# Patient Record
Sex: Male | Born: 1945 | Race: Black or African American | Hispanic: No | Marital: Married | Smoking: Never smoker
Health system: Southern US, Community
[De-identification: ages and names within clinical notes are randomized; demographics above are authoritative.]

## PROBLEM LIST (undated history)

## (undated) DIAGNOSIS — I10 Essential (primary) hypertension: Secondary | ICD-10-CM

## (undated) DIAGNOSIS — M109 Gout, unspecified: Secondary | ICD-10-CM

## (undated) DIAGNOSIS — E785 Hyperlipidemia, unspecified: Secondary | ICD-10-CM

## (undated) DIAGNOSIS — N189 Chronic kidney disease, unspecified: Secondary | ICD-10-CM

## (undated) HISTORY — DX: Hyperlipidemia, unspecified: E78.5

## (undated) HISTORY — PX: OTHER SURGICAL HISTORY: SHX169

## (undated) HISTORY — DX: Gout, unspecified: M10.9

---

## 2006-12-10 ENCOUNTER — Ambulatory Visit: Payer: Self-pay | Admitting: Gastroenterology

## 2008-09-11 ENCOUNTER — Ambulatory Visit: Payer: Self-pay | Admitting: Family Medicine

## 2009-10-04 ENCOUNTER — Encounter: Payer: Self-pay | Admitting: Rheumatology

## 2009-10-18 ENCOUNTER — Encounter: Payer: Self-pay | Admitting: Rheumatology

## 2013-10-23 ENCOUNTER — Encounter: Payer: Self-pay | Admitting: Podiatry

## 2013-10-23 ENCOUNTER — Ambulatory Visit (INDEPENDENT_AMBULATORY_CARE_PROVIDER_SITE_OTHER): Payer: 59 | Admitting: Podiatry

## 2013-10-23 ENCOUNTER — Ambulatory Visit (INDEPENDENT_AMBULATORY_CARE_PROVIDER_SITE_OTHER): Payer: 59

## 2013-10-23 VITALS — BP 128/66 | HR 61 | Resp 18 | Ht 72.0 in | Wt 204.0 lb

## 2013-10-23 DIAGNOSIS — M7660 Achilles tendinitis, unspecified leg: Secondary | ICD-10-CM

## 2013-10-23 NOTE — Progress Notes (Signed)
   Subjective:    Patient ID: Pedro Robinson, male    DOB: 05-04-1946, 68 y.o.   MRN: [id number]  HPI Comments: Right achilles, must have injured it last Friday while running up a hill at the hospital and Saturday morning it started hurting . Back of heel is throbbing.  Get a sharp depending on position of foot . Put a ace wrap on it   Foot Pain      Review of Systems  All other systems reviewed and are negative.       Objective:   Physical Exam        Assessment & Plan:

## 2013-10-23 NOTE — Progress Notes (Signed)
Subjective:     Patient ID: Pedro Robinson, male   DOB: 1946-02-16, 68 y.o.   MRN: [id number]  Foot Pain   patient points to the right Achilles tendon stating I injured it Friday and I'm not sure if I tore it or what might be going on. States he was running uphill and started to feel it   Review of Systems  All other systems reviewed and are negative.       Objective:   Physical Exam  Nursing note and vitals reviewed. Constitutional: He is oriented to person, place, and time.  Cardiovascular: Intact distal pulses.   Musculoskeletal: Normal range of motion.  Neurological: He is oriented to person, place, and time.  Skin: Skin is warm.   neurovascular status intact with muscle strength adequate and discomfort in the right Achilles tendon mostly at the insertion into the calcaneus with some more proximal pain noted a mild to moderate nature. Checked the strength of Achilles found to be adequate with no indications of tear of the Achilles tendon    Assessment:     Acute Achilles tendinitis with inability for the patient to walk comfortably on this area    Plan:     H&P and x-ray reviewed with patient. Today I went ahead and I placed an air fracture walker to completely immobilize the tendon and try to take all stress off the posterior heel. Reappoint for us to recheck again in 4 weeks

## 2013-10-23 NOTE — Patient Instructions (Signed)

## 2013-11-05 DIAGNOSIS — M7660 Achilles tendinitis, unspecified leg: Secondary | ICD-10-CM

## 2013-11-07 ENCOUNTER — Ambulatory Visit: Payer: Self-pay | Admitting: Physician Assistant

## 2014-12-27 ENCOUNTER — Encounter: Payer: Self-pay | Admitting: *Deleted

## 2015-02-08 ENCOUNTER — Ambulatory Visit: Payer: 59

## 2015-02-08 ENCOUNTER — Ambulatory Visit
Admission: EM | Admit: 2015-02-08 | Discharge: 2015-02-08 | Disposition: A | Discharge status: Home / Self Care | Payer: 59 | Attending: Family Medicine | Admitting: Family Medicine

## 2015-02-08 DIAGNOSIS — R51 Headache: Secondary | ICD-10-CM

## 2015-02-08 DIAGNOSIS — M542 Cervicalgia: Secondary | ICD-10-CM | POA: Diagnosis not present

## 2015-02-08 DIAGNOSIS — R519 Headache, unspecified: Secondary | ICD-10-CM

## 2015-02-08 HISTORY — DX: Essential (primary) hypertension: I10

## 2015-02-08 MED ORDER — METAXALONE 800 MG PO TABS: 800.0000 mg | ORAL_TABLET | Freq: Three times a day (TID) | ORAL | Status: DC

## 2015-02-08 MED ORDER — MELOXICAM 15 MG PO TABS: 15.0000 mg | ORAL_TABLET | Freq: Every day | ORAL | Status: DC

## 2015-02-08 NOTE — ED Provider Notes (Signed)
CSN: [id number]     Arrival date & time 02/08/15  6962 History   First MD Initiated Contact with Patient 02/08/15 0919     Chief Complaint  Patient presents with  . Neck Pain   (Consider location/radiation/quality/duration/timing/severity/associated sxs/prior Treatment) Patient is a 69 y.o. male presenting with neck pain. The history is provided by the patient.  Neck Pain Pain location:  Generalized neck (Neck pain started about 2-3 weeks ago cleared for a week and then restarted yesterday) Quality:  Stabbing Pain radiates to:  Does not radiate Pain severity:  Moderate Pain is:  Same all the time Onset quality:  Sudden Duration: Started 1-2 days ago after bothering him for about 2 weeks. Progression:  Waxing and waning Chronicity:  New Context: not fall, not jumping from heights, not lifting a heavy object, not MCA, not MVA and not recent injury   Context comment:  Denies any trauma or headaches. Relieved by: Warm compresses. Worsened by:  Nothing tried Ineffective treatments:  None tried Associated symptoms: no bladder incontinence, no bowel incontinence, no chest pain, no fever, no headaches, no leg pain, no numbness, no paresis, no photophobia, no visual change, no weakness and no weight loss   Risk factors: no hx of head and neck radiation, no hx of osteoporosis, no hx of spinal trauma, no recent epidural, no recent head injury and no recurrent falls     Past Medical History  Diagnosis Date  . Hypertension    Past Surgical History  Procedure Laterality Date  . Left foot     History reviewed. No pertinent family history. History  Substance Use Topics  . Smoking status: Never Smoker   . Smokeless tobacco: Never Used  . Alcohol Use: No    Review of Systems  Constitutional: Negative for fever and weight loss.  Eyes: Negative for photophobia.  Cardiovascular: Negative for chest pain.  Gastrointestinal: Negative for bowel incontinence.  Genitourinary: Negative for  bladder incontinence.  Musculoskeletal: Positive for neck pain.  Neurological: Negative for weakness, numbness and headaches.  All other systems reviewed and are negative.   Allergies  Ivp dye  Home Medications   Prior to Admission medications   Medication Sig Start Date End Date Taking? Authorizing Provider  aspirin 81 MG tablet Take 81 mg by mouth daily.   Yes Historical Provider, MD  cloNIDine (CATAPRES) 0.2 MG tablet Take 0.2 mg by mouth 2 (two) times daily.   Yes Historical Provider, MD  fluticasone (VERAMYST) 27.5 MCG/SPRAY nasal spray Place 2 sprays into the nose daily.   Yes Historical Provider, MD  Misc Natural Products (OSTEO BI-FLEX ADV DOUBLE ST) CAPS Take by mouth.   Yes Historical Provider, MD  Multiple Vitamins-Minerals (MULTIVITAMIN WITH MINERALS) tablet Take 1 tablet by mouth daily.   Yes Historical Provider, MD  Olmesartan-Amlodipine-HCTZ (TRIBENZOR) 40-10-25 MG TABS Take by mouth.   Yes Historical Provider, MD  Omega-3 Fatty Acids (FISH OIL) 1000 MG CAPS Take by mouth.   Yes Historical Provider, MD  rosuvastatin (CRESTOR) 10 MG tablet Take 10 mg by mouth daily.   Yes Historical Provider, MD  senna-docusate (PERI-COLACE) 8.6-50 MG per tablet Take 3 tablets by mouth at bedtime as needed for mild constipation.   Yes Historical Provider, MD  meloxicam (MOBIC) 15 MG tablet Take 1 tablet (15 mg total) by mouth daily. 02/08/15   Hassan Rowan, MD  metaxalone (SKELAXIN) 800 MG tablet Take 1 tablet (800 mg total) by mouth 3 (three) times daily. 02/08/15   Hassan Rowan, MD  traMADol-acetaminophen (ULTRACET) 37.5-325 MG per tablet Take 1 tablet by mouth every 6 (six) hours as needed.    Historical Provider, MD   BP 140/67 mmHg  Temp(Src) 98.4 F (36.9 C) (Tympanic)  Resp 16  Ht 6' (1.829 m)  Wt 206 lb (93.441 kg)  BMI 27.93 kg/m2  SpO2 99% Physical Exam  Constitutional: He is oriented to person, place, and time. He appears well-developed and well-nourished.  HENT:  Head:  Normocephalic and atraumatic.  Eyes: Pupils are equal, round, and reactive to light.  Neck: Normal range of motion. Neck supple. No spinous process tenderness and no muscular tenderness present. No tracheal deviation and no erythema present.    Musculoskeletal: Normal range of motion.  Lymphadenopathy:    He has no cervical adenopathy.  Neurological: He is oriented to person, place, and time.  Skin: Skin is warm.  Vitals reviewed.   ED Course  Procedures (including critical care time) Labs Review Labs Reviewed - No data to display I tried to reassure patient that the pain on the neck is over the occipital opening but not over the cervical spine. Because there has been no trauma and no tenderness over the C-spine I'm not too eager to recommend cervical x-rays. However since this has been occurring off and on for the last 3-4 weeks he requests x-ray and will acquiesce to his request. Imaging Review Dg Cervical Spine Complete  02/08/2015   CLINICAL DATA:  Neck pain, stiffness with limited range of motion 1 week ago. No known injury. Pain with movement in any direction.  EXAM: CERVICAL SPINE  4+ VIEWS  COMPARISON:  None.  FINDINGS: Severe degenerative disc disease throughout the cervical spine with near complete fusion across the disc spaces from C3-4 -C6-7. Partial fusion across the 17 1. Mild right neural foraminal narrowing at C4-5 and C6-7. No fracture. Prevertebral soft tissues are normal.  IMPRESSION: Severe degenerative disc disease with fusion from C3-4 through C6-7, partial fusion across C7-T1.  Right neural foraminal narrowing as above.  No acute bony abnormality.   Electronically Signed   By: Charlett NoseKevin  Dover M.D.   On: 02/08/2015 11:08     MDM   1. Occipital pain   2. Neck pain, bilateral        Hassan RowanEugene Keiko Myricks, MD 02/08/15 1540

## 2015-02-08 NOTE — ED Notes (Signed)
Pt states " 2 weeks ago I developed a stiff neck, around the base of my skull more than my neck. The pain got better and then returned yesterday."

## 2015-02-08 NOTE — Discharge Instructions (Signed)
Neuropathic Pain We often think that pain has a physical cause. If we get rid of the cause, the pain should go away. Nerves themselves can also cause pain. It is called neuropathic pain, which means nerve abnormality. It may be difficult for the patients who have it and for the treating caregivers. Pain is usually described as acute (short-lived) or chronic (long-lasting). Acute pain is related to the physical sensations caused by an injury. It can last from a few seconds to many weeks, but it usually goes away when normal healing occurs. Chronic pain lasts beyond the typical healing time. With neuropathic pain, the nerve fibers themselves may be damaged or injured. They then send incorrect signals to other pain centers. The pain you feel is real, but the cause is not easy to find.  CAUSES  Chronic pain can result from diseases, such as diabetes and shingles (an infection related to chickenpox), or from trauma, surgery, or amputation. It can also happen without any known injury or disease. The nerves are sending pain messages, even though there is no identifiable cause for such messages.   Other common causes of neuropathy include diabetes, phantom limb pain, or Regional Pain Syndrome (RPS).  As with all forms of chronic back pain, if neuropathy is not correctly treated, there can be a number of associated problems that lead to a downward cycle for the patient. These include depression, sleeplessness, feelings of fear and anxiety, limited social interaction and inability to do normal daily activities or work.  The most dramatic and mysterious example of neuropathic pain is called "phantom limb syndrome." This occurs when an arm or a leg has been removed because of illness or injury. The brain still gets pain messages from the nerves that originally carried impulses from the missing limb. These nerves now seem to misfire and cause troubling pain.  Neuropathic pain often seems to have no cause. It responds  poorly to standard pain treatment. Neuropathic pain can occur after:  Shingles (herpes zoster virus infection).  A lasting burning sensation of the skin, caused usually by injury to a peripheral nerve.  Peripheral neuropathy which is widespread nerve damage, often caused by diabetes or alcoholism.  Phantom limb pain following an amputation.  Facial nerve problems (trigeminal neuralgia).  Multiple sclerosis.  Reflex sympathetic dystrophy.  Pain which comes with cancer and cancer chemotherapy.  Entrapment neuropathy such as when pressure is put on a nerve such as in carpal tunnel syndrome.  Back, leg, and hip problems (sciatica).  Spine or back surgery.  HIV Infection or AIDS where nerves are infected by viruses. Your caregiver can explain items in the above list which may apply to you. SYMPTOMS  Characteristics of neuropathic pain are:  Severe, sharp, electric shock-like, shooting, lightening-like, knife-like.  Pins and needles sensation.  Deep burning, deep cold, or deep ache.  Persistent numbness, tingling, or weakness.  Pain resulting from light touch or other stimulus that would not usually cause pain.  Increased sensitivity to something that would normally cause pain, such as a pinprick. Pain may persist for months or years following the healing of damaged tissues. When this happens, pain signals no longer sound an alarm about current injuries or injuries about to happen. Instead, the alarm system itself is not working correctly.  Neuropathic pain may get worse instead of better over time. For some people, it can lead to serious disability. It is important to be aware that severe injury in a limb can occur without a proper, protective pain  response.Burns, cuts, and other injuries may go unnoticed. Without proper treatment, these injuries can become infected or lead to further disability. Take any injury seriously, and consult your caregiver for treatment. DIAGNOSIS    When you have a pain with no known cause, your caregiver will probably ask some specific questions:   Do you have any other conditions, such as diabetes, shingles, multiple sclerosis, or HIV infection?  How would you describe your pain? (Neuropathic pain is often described as shooting, stabbing, burning, or searing.)  Is your pain worse at any time of the day? (Neuropathic pain is usually worse at night.)  Does the pain seem to follow a certain physical pathway?  Does the pain come from an area that has missing or injured nerves? (An example would be phantom limb pain.)  Is the pain triggered by minor things such as rubbing against the sheets at night? These questions often help define the type of pain involved. Once your caregiver knows what is happening, treatment can begin. Anticonvulsant, antidepressant drugs, and various pain relievers seem to work in some cases. If another condition, such as diabetes is involved, better management of that disorder may relieve the neuropathic pain.  TREATMENT  Neuropathic pain is frequently long-lasting and tends not to respond to treatment with narcotic type pain medication. It may respond well to other drugs such as antiseizure and antidepressant medications. Usually, neuropathic problems do not completely go away, but partial improvement is often possible with proper treatment. Your caregivers have large numbers of medications available to treat you. Do not be discouraged if you do not get immediate relief. Sometimes different medications or a combination of medications will be tried before you receive the results you are hoping for. See your caregiver if you have pain that seems to be coming from nowhere and does not go away. Help is available.  SEEK IMMEDIATE MEDICAL CARE IF:   There is a sudden change in the quality of your pain, especially if the change is on only one side of the body.  You notice changes of the skin, such as redness, black or  purple discoloration, swelling, or an ulcer.  You cannot move the affected limbs. Document Released: 05/31/2004 Document Revised: 11/26/2011 Document Reviewed: 05/31/2004 [facility] Patient Information 2015 [place], [place]. This information is not intended to replace advice given to you by your health care provider. Make sure you discuss any questions you have with your health care provider. Occipital Neuralgia Occipital neuralgia is a type of headache that causes episodes of very bad pain in the back of your head. Pain from occipital neuralgia may spread (radiate) to other parts of your head. The pain is usually brief and often goes away after you rest and relax. These headaches may be caused by irritation of the nerves that leave your spinal cord high up in your neck, just below the base of your skull (occipital nerves). Your occipital nerves transmit sensations from the back of your head, the top of your head, and the areas behind your ears. CAUSES Occipital neuralgia can occur without any known cause (primary headache syndrome). In other cases, occipital neuralgia is caused by pressure on or irritation of one of the two occipital nerves. Causes of occipital nerve compression or irritation include:  Wear and tear of the vertebrae in the neck (osteoarthritis).  Neck injury.  Disease of the disks that separate the vertebrae.  Tumors.  Gout.  Infections.  Diabetes.  Swollen blood vessels that put pressure on the occipital nerves.  Muscle spasm in the neck. SIGNS AND SYMPTOMS Pain is the main symptom of occipital neuralgia. It usually starts in the back of the head but may also be felt in other areas supplied by the occipital nerves. Pain is usually on one side but may be on both sides. You may have:   Brief episodes of very bad pain that is burning, stabbing, shocking, or shooting.  Pain behind the eye.  Pain triggered by neck movement or hair brushing.  Scalp tenderness.  Aching  in the back of the head between episodes of very bad pain. DIAGNOSIS  Your health care provider may diagnose occipital neuralgia based on your symptoms and a physical exam. During the exam, the health care provider may push on areas supplied by the occipital nerves to see if they are painful. Some tests may also be done to help in making the diagnosis. These may include:  Imaging studies of the upper spinal cord, such as an MRI or CT scan. These may show compression or spinal cord abnormalities.  Nerve block. You will get an injection of numbing medicine (local anesthetic) near the occipital nerve to see if this relieves pain. TREATMENT  Treatment may begin with simple measures, such as:   Rest.  Massage.  Heat.  Over-the-counter pain relievers. If these measures do not work, you may need other treatments, including:  Medicines such as:  Prescription-strength anti-inflammatory medicines.  Muscle relaxants.  Antiseizure medicines.  Antidepressants.  Steroid injection. This involves injections of local anesthetic and strong anti-inflammatory drugs (steroids).  Pulsed radiofrequency. Wires are implanted to deliver electrical impulses that block pain signals from the occipital nerve.  Physical therapy.  Surgery to relieve nerve pressure. HOME CARE INSTRUCTIONS  Take all medicines as directed by your health care provider.  Avoid activities that cause pain.  Rest when you have an attack of pain.  Try gentle massage or a heating pad to relieve pain.  Work with a physical therapist to learn stretching exercises you can do at home.  Try a different pillow or sleeping position.  Practice good posture.  Try to stay active. Get regular exercise that does not cause pain. Ask your health care provider to suggest safe exercises for you.  Keep all follow-up visits as directed by your health care provider. This is important. SEEK MEDICAL CARE IF:  Your medicine is not  working.  You have new or worsening symptoms. SEEK IMMEDIATE MEDICAL CARE IF:  You have very bad head pain that is not going away.  You have a sudden change in vision, balance, or speech. MAKE SURE YOU:  Understand these instructions.  Will watch your condition.  Will get help right away if you are not doing well or get worse. Document Released: 08/28/2001 Document Revised: 01/18/2014 Document Reviewed: 08/26/2013 [facility] Patient Information 2015 [place]. This information is not intended to replace advice given to you by your health care provider. Make sure you discuss any questions you have with your health care provider. Osteoarthritis Osteoarthritis is a disease that causes soreness and inflammation of a joint. It occurs when the cartilage at the affected joint wears down. Cartilage acts as a cushion, covering the ends of bones where they meet to form a joint. Osteoarthritis is the most common form of arthritis. It often occurs in older people. The joints affected most often by this condition include those in the:  Ends of the fingers.  Thumbs.  Neck.  Lower back.  Knees.  Hips. CAUSES  Over  time, the cartilage that covers the ends of bones begins to wear away. This causes bone to rub on bone, producing pain and stiffness in the affected joints.  RISK FACTORS Certain factors can increase your chances of having osteoarthritis, including:  Older age.  Excessive body weight.  Overuse of joints.  Previous joint injury. SIGNS AND SYMPTOMS   Pain, swelling, and stiffness in the joint.  Over time, the joint may lose its normal shape.  Small deposits of bone (osteophytes) may grow on the edges of the joint.  Bits of bone or cartilage can break off and float inside the joint space. This may cause more pain and damage. DIAGNOSIS  Your health care provider will do a physical exam and ask about your symptoms. Various tests may be ordered, such as:  X-rays of the  affected joint.  An MRI scan.  Blood tests to rule out other types of arthritis.  Joint fluid tests. This involves using a needle to draw fluid from the joint and examining the fluid under a microscope. TREATMENT  Goals of treatment are to control pain and improve joint function. Treatment plans may include:  A prescribed exercise program that allows for rest and joint relief.  A weight control plan.  Pain relief techniques, such as:  Properly applied heat and cold.  Electric pulses delivered to nerve endings under the skin (transcutaneous electrical nerve stimulation [TENS]).  Massage.  Certain nutritional supplements.  Medicines to control pain, such as:  Acetaminophen.  Nonsteroidal anti-inflammatory drugs (NSAIDs), such as naproxen.  Narcotic or central-acting agents, such as tramadol.  Corticosteroids. These can be given orally or as an injection.  Surgery to reposition the bones and relieve pain (osteotomy) or to remove loose pieces of bone and cartilage. Joint replacement may be needed in advanced states of osteoarthritis. HOME CARE INSTRUCTIONS   Take medicines only as directed by your health care provider.  Maintain a healthy weight. Follow your health care provider's instructions for weight control. This may include dietary instructions.  Exercise as directed. Your health care provider can recommend specific types of exercise. These may include:  Strengthening exercises. These are done to strengthen the muscles that support joints affected by arthritis. They can be performed with weights or with exercise bands to add resistance.  Aerobic activities. These are exercises, such as brisk walking or low-impact aerobics, that get your heart pumping.  Range-of-motion activities. These keep your joints limber.  Balance and agility exercises. These help you maintain daily living skills.  Rest your affected joints as directed by your health care provider.  Keep  all follow-up visits as directed by your health care provider. SEEK MEDICAL CARE IF:   Your skin turns red.  You develop a rash in addition to your joint pain.  You have worsening joint pain.  You have a fever along with joint or muscle aches. SEEK IMMEDIATE MEDICAL CARE IF:  You have a significant loss of weight or appetite.  You have night sweats. FOR MORE INFORMATION   National Institute of Arthritis and Musculoskeletal and Skin Diseases: www.niams.http://www.myers.net/  [employer] on Aging: https://walker.com/  American College of Rheumatology: www.rheumatology.org Document Released: 09/03/2005 Document Revised: 01/18/2014 Document Reviewed: 05/11/2013 [facility] Patient Information 2015 [place], [place]. This information is not intended to replace advice given to you by your health care provider. Make sure you discuss any questions you have with your health care provider.

## 2015-10-21 DIAGNOSIS — E749 Disorder of carbohydrate metabolism, unspecified: Secondary | ICD-10-CM | POA: Diagnosis not present

## 2015-10-21 DIAGNOSIS — I10 Essential (primary) hypertension: Secondary | ICD-10-CM | POA: Diagnosis not present

## 2015-10-21 DIAGNOSIS — E785 Hyperlipidemia, unspecified: Secondary | ICD-10-CM | POA: Diagnosis not present

## 2015-10-28 ENCOUNTER — Encounter: Payer: Self-pay | Admitting: Emergency Medicine

## 2015-10-28 ENCOUNTER — Ambulatory Visit
Admission: EM | Admit: 2015-10-28 | Discharge: 2015-10-28 | Disposition: A | Discharge status: Home / Self Care | Payer: 59 | Attending: Family Medicine | Admitting: Family Medicine

## 2015-10-28 DIAGNOSIS — R05 Cough: Secondary | ICD-10-CM | POA: Diagnosis not present

## 2015-10-28 DIAGNOSIS — J40 Bronchitis, not specified as acute or chronic: Secondary | ICD-10-CM | POA: Diagnosis not present

## 2015-10-28 DIAGNOSIS — R059 Cough, unspecified: Secondary | ICD-10-CM

## 2015-10-28 LAB — RAPID INFLUENZA A&B ANTIGENS (ARMC ONLY)
INFLUENZA A (ARMC): NOT DETECTED
INFLUENZA B (ARMC): NOT DETECTED

## 2015-10-28 MED ORDER — AZITHROMYCIN 250 MG PO TABS: ORAL_TABLET | ORAL | Status: DC

## 2015-10-28 NOTE — ED Provider Notes (Signed)
CSN: [id number]     Arrival date & time 10/28/15  1214 History   First MD Initiated Contact with Patient 10/28/15 1415     Chief Complaint  Patient presents with  . Influenza   (Consider location/radiation/quality/duration/timing/severity/associated sxs/prior Treatment) Patient is a 70 y.o. male presenting with flu symptoms and URI. The history is provided by the patient.  Influenza Presenting symptoms: cough, fatigue, fever, headache and rhinorrhea   Associated symptoms: nasal congestion   URI Presenting symptoms: congestion, cough, fatigue, fever and rhinorrhea   Severity:  Moderate Onset quality:  Sudden Duration:  1 week Timing:  Constant Progression:  Worsening Chronicity:  New Relieved by:  Nothing Ineffective treatments:  OTC medications Associated symptoms: headaches and sinus pain   Risk factors: sick contacts   Risk factors: no chronic kidney disease, no chronic respiratory disease, no diabetes mellitus, no immunosuppression and no recent travel     Past Medical History  Diagnosis Date  . Hypertension    Past Surgical History  Procedure Laterality Date  . Left foot     No family history on file. Social History  Substance Use Topics  . Smoking status: Never Smoker   . Smokeless tobacco: Never Used  . Alcohol Use: No    Review of Systems  Constitutional: Positive for fever and fatigue.  HENT: Positive for congestion and rhinorrhea.   Respiratory: Positive for cough.   Neurological: Positive for headaches.    Allergies  Ivp dye  Home Medications   Prior to Admission medications   Medication Sig Start Date End Date Taking? Authorizing Provider  aspirin 81 MG tablet Take 81 mg by mouth daily.    Historical Provider, MD  azithromycin (ZITHROMAX Z-PAK) 250 MG tablet 2 tabs po once day 1, then 1 tab po qd for next 4 days 10/28/15   Payton Mccallum, MD  cloNIDine (CATAPRES) 0.2 MG tablet Take 0.2 mg by mouth 2 (two) times daily.    Historical Provider, MD   fluticasone (VERAMYST) 27.5 MCG/SPRAY nasal spray Place 2 sprays into the nose daily.    Historical Provider, MD  meloxicam (MOBIC) 15 MG tablet Take 1 tablet (15 mg total) by mouth daily. 02/08/15   Hassan Rowan, MD  metaxalone (SKELAXIN) 800 MG tablet Take 1 tablet (800 mg total) by mouth 3 (three) times daily. 02/08/15   Hassan Rowan, MD  Misc Natural Products (OSTEO BI-FLEX ADV DOUBLE ST) CAPS Take by mouth.    Historical Provider, MD  Multiple Vitamins-Minerals (MULTIVITAMIN WITH MINERALS) tablet Take 1 tablet by mouth daily.    Historical Provider, MD  Olmesartan-Amlodipine-HCTZ (TRIBENZOR) 40-10-25 MG TABS Take by mouth.    Historical Provider, MD  Omega-3 Fatty Acids (FISH OIL) 1000 MG CAPS Take by mouth.    Historical Provider, MD  rosuvastatin (CRESTOR) 10 MG tablet Take 10 mg by mouth daily.    Historical Provider, MD  senna-docusate (PERI-COLACE) 8.6-50 MG per tablet Take 3 tablets by mouth at bedtime as needed for mild constipation.    Historical Provider, MD  traMADol-acetaminophen (ULTRACET) 37.5-325 MG per tablet Take 1 tablet by mouth every 6 (six) hours as needed.    Historical Provider, MD   Meds Ordered and Administered this Visit  Medications - No data to display  BP 160/75 mmHg  Pulse 62  Temp(Src) 99.6 F (37.6 C) (Tympanic)  Resp 18  Ht 6' (1.829 m)  Wt 203 lb (92.08 kg)  BMI 27.53 kg/m2  SpO2 99% No data found.   Physical Exam  Constitutional: He appears well-developed and well-nourished. No distress.  HENT:  Head: Normocephalic and atraumatic.  Right Ear: Tympanic membrane, external ear and ear canal normal.  Left Ear: Tympanic membrane, external ear and ear canal normal.  Nose: Nose normal.  Mouth/Throat: Uvula is midline, oropharynx is clear and moist and mucous membranes are normal. No oropharyngeal exudate or tonsillar abscesses.  Eyes: Conjunctivae and EOM are normal. Pupils are equal, round, and reactive to light. Right eye exhibits no discharge. Left  eye exhibits no discharge. No scleral icterus.  Neck: Normal range of motion. Neck supple. No tracheal deviation present. No thyromegaly present.  Cardiovascular: Normal rate, regular rhythm and normal heart sounds.   Pulmonary/Chest: Effort normal and breath sounds normal. No stridor. No respiratory distress. He has no wheezes. He has no rales. He exhibits no tenderness.  Diffuse rhonchi bilaterally  Lymphadenopathy:    He has no cervical adenopathy.  Neurological: He is alert.  Skin: Skin is warm and dry. No rash noted. He is not diaphoretic.  Nursing note and vitals reviewed.   ED Course  Procedures (including critical care time)  Labs Review Labs Reviewed  RAPID INFLUENZA A&B ANTIGENS [facility] ONLY)    Imaging Review No results found.   Visual Acuity Review  Right Eye Distance:   Left Eye Distance:   Bilateral Distance:    Right Eye Near:   Left Eye Near:    Bilateral Near:         MDM   1. Cough   2. Bronchitis    Discharge Medication List as of 10/28/2015  2:29 PM    START taking these medications   Details  azithromycin (ZITHROMAX Z-PAK) 250 MG tablet 2 tabs po once day 1, then 1 tab po qd for next 4 days, Normal       1. diagnosis reviewed with patient 2. rx as per orders above; reviewed possible side effects, interactions, risks and benefits  3. Recommend supportive treatment with rest, increased fluids 4. Follow-up prn if symptoms worsen or don't improve    Payton Mccallum, MD 10/28/15 1450

## 2015-10-28 NOTE — ED Notes (Signed)
Temperature 103.2, cough, chills, cold, bodyaches for 3 days

## 2015-11-02 DIAGNOSIS — N289 Disorder of kidney and ureter, unspecified: Secondary | ICD-10-CM | POA: Diagnosis not present

## 2015-11-02 DIAGNOSIS — I10 Essential (primary) hypertension: Secondary | ICD-10-CM | POA: Diagnosis not present

## 2015-11-25 DIAGNOSIS — Z7689 Persons encountering health services in other specified circumstances: Secondary | ICD-10-CM | POA: Diagnosis not present

## 2015-12-05 DIAGNOSIS — J301 Allergic rhinitis due to pollen: Secondary | ICD-10-CM | POA: Diagnosis not present

## 2015-12-05 DIAGNOSIS — E749 Disorder of carbohydrate metabolism, unspecified: Secondary | ICD-10-CM | POA: Diagnosis not present

## 2015-12-05 DIAGNOSIS — I10 Essential (primary) hypertension: Secondary | ICD-10-CM | POA: Diagnosis not present

## 2015-12-05 DIAGNOSIS — E785 Hyperlipidemia, unspecified: Secondary | ICD-10-CM | POA: Diagnosis not present

## 2015-12-05 DIAGNOSIS — N411 Chronic prostatitis: Secondary | ICD-10-CM | POA: Diagnosis not present

## 2015-12-05 DIAGNOSIS — N400 Enlarged prostate without lower urinary tract symptoms: Secondary | ICD-10-CM | POA: Diagnosis not present

## 2015-12-05 DIAGNOSIS — F5221 Male erectile disorder: Secondary | ICD-10-CM | POA: Diagnosis not present

## 2015-12-05 DIAGNOSIS — D649 Anemia, unspecified: Secondary | ICD-10-CM | POA: Diagnosis not present

## 2015-12-05 DIAGNOSIS — N289 Disorder of kidney and ureter, unspecified: Secondary | ICD-10-CM | POA: Diagnosis not present

## 2015-12-21 DIAGNOSIS — N419 Inflammatory disease of prostate, unspecified: Secondary | ICD-10-CM | POA: Diagnosis not present

## 2015-12-26 DIAGNOSIS — N289 Disorder of kidney and ureter, unspecified: Secondary | ICD-10-CM | POA: Diagnosis not present

## 2015-12-26 DIAGNOSIS — I10 Essential (primary) hypertension: Secondary | ICD-10-CM | POA: Diagnosis not present

## 2016-01-16 ENCOUNTER — Encounter: Payer: Self-pay | Admitting: Urology

## 2016-01-16 ENCOUNTER — Ambulatory Visit (INDEPENDENT_AMBULATORY_CARE_PROVIDER_SITE_OTHER): Payer: 59 | Admitting: Urology

## 2016-01-16 ENCOUNTER — Ambulatory Visit: Payer: Self-pay

## 2016-01-16 VITALS — BP 154/84 | HR 59 | Ht 72.0 in | Wt 201.0 lb

## 2016-01-16 DIAGNOSIS — R972 Elevated prostate specific antigen [PSA]: Secondary | ICD-10-CM | POA: Diagnosis not present

## 2016-01-16 NOTE — Progress Notes (Addendum)
01/16/2016 3:37 PM   Hoover BrunetteClifton B Brodhead [birth year] [id number]  Referring provider: Sherron MondayS Ahmed Tejan-Sie, MD [address] [place], [place] [address]  Chief Complaint  Patient presents with  . Elevated PSA    New Patient    HPI: 70 year old male who is referred for further evaluation and management of an elevated PSA. This was obtained as part of the patient's annual history and physical. The patient was noted to have a PSA of 4.6 and was given a ten-day course of antibiotics followed by repeat PSA which dropped to 4.2. The patient denies any significant progression of his voiding symptoms. He denies any frequency, urgency, or incontinence. He does describe difficulty voiding while taking over-the-counter cough medications for which he was taking several weeks ago. The patient also has a family history of prostate cancer, his brother was diagnosed with widely metastatic prostate cancer at the age of 70 and eventually died of his disease. The patient denies any gross hematuria or dysuria. He has no history of recurrent urinary tract infections.  PSA:  4.2 on 12/21/15 4.6 on 11/25/15 3.5 on 07/2014 3.5 on 05/2013    IPSS: 1, QoL 1 SHIM: 23  PMH: Past Medical History  Diagnosis Date  . Hypertension   . Gout   . Hyperlipemia     Surgical History: Past Surgical History  Procedure Laterality Date  . Left foot      Home Medications:    Medication List       This list is accurate as of: 01/16/16  3:37 PM.  Always use your most recent med list.               aspirin 81 MG tablet  Take 81 mg by mouth daily.     cloNIDine 0.1 MG tablet  Commonly known as:  CATAPRES     Fish Oil 1000 MG Caps  Take by mouth.     fluticasone 27.5 MCG/SPRAY nasal spray  Commonly known as:  VERAMYST  Place 2 sprays into the nose daily.     hydrALAZINE 50 MG tablet  Commonly known as:  APRESOLINE     meloxicam 15 MG tablet  Commonly known as:  MOBIC  Take 1 tablet (15 mg total) by mouth  daily.     multivitamin with minerals tablet  Take 1 tablet by mouth daily.     OSTEO BI-FLEX ADV DOUBLE ST Caps  Take by mouth.     rosuvastatin 5 MG tablet  Commonly known as:  CRESTOR     TRIBENZOR 40-10-25 MG Tabs  Generic drug:  Olmesartan-Amlodipine-HCTZ  Take by mouth.        Allergies:  Allergies  Allergen Reactions  . Ivp Dye [Iodinated Diagnostic Agents] Nausea And Vomiting    Hot flashes     Family History: Family History  Problem Relation Age of Onset  . Prostate cancer Brother   . Kidney failure Mother   . Bladder Cancer Neg Hx   . Kidney cancer Neg Hx   . Lung cancer Brother     Social History:  reports that he has never smoked. He has never used smokeless tobacco. He reports that he does not drink alcohol or use illicit drugs.  ROS: UROLOGY Frequent Urination?: No Hard to postpone urination?: No Burning/pain with urination?: No Get up at night to urinate?: No Leakage of urine?: No Urine stream starts and stops?: No Trouble starting stream?: No Do you have to strain to urinate?: No Blood in urine?: No  Urinary tract infection?: No Sexually transmitted disease?: No Injury to kidneys or bladder?: No Painful intercourse?: No Weak stream?: No Erection problems?: No Penile pain?: No  Gastrointestinal Nausea?: No Vomiting?: No Indigestion/heartburn?: No Diarrhea?: No Constipation?: No  Constitutional Fever: No Night sweats?: No Weight loss?: No Fatigue?: No  Skin Skin rash/lesions?: No Itching?: No  Eyes Blurred vision?: No Double vision?: No  Ears/Nose/Throat Sore throat?: No Sinus problems?: No  Hematologic/Lymphatic Swollen glands?: No Easy bruising?: No  Cardiovascular Leg swelling?: No Chest pain?: No  Respiratory Cough?: No Shortness of breath?: No  Endocrine Excessive thirst?: No  Musculoskeletal Back pain?: No Joint pain?: No  Neurological Headaches?: No Dizziness?: No  Psychologic Depression?:  No Anxiety?: No  Physical Exam: BP 154/84 mmHg  Pulse 59  Ht 6' (1.829 m)  Wt 201 lb (91.173 kg)  BMI 27.25 kg/m2  Constitutional:  Alert and oriented, No acute distress. HEENT: [place] AT, moist mucus membranes.  Trachea midline, no masses. Cardiovascular: No clubbing, cyanosis, or edema. Respiratory: Normal respiratory effort, no increased work of breathing. GI: Abdomen is soft, nontender, nondistended, no abdominal masses GU: No CVA tenderness.  DRE: prostate was +2 in size, symmetric, no nodules, nontender Skin: No rashes, bruises or suspicious lesions. Lymph: No cervical or inguinal adenopathy. Neurologic: Grossly intact, no focal deficits, moving all 4 extremities. Psychiatric: Normal mood and affect.  Laboratory Data: No results found for: WBC, HGB, HCT, MCV, PLT  No results found for: CREATININE  No results found for: PSA  No results found for: TESTOSTERONE  No results found for: HGBA1C  Urinalysis No results found for: COLORURINE, APPEARANCEUR, LABSPEC, PHURINE, GLUCOSEU, HGBUR, BILIRUBINUR, KETONESUR, PROTEINUR, UROBILINOGEN, NITRITE, LEUKOCYTESUR  Pertinent Imaging:   Assessment & Plan:  The patient has an elevated PSA, a history of prostate cancer within his family, as well as African-American lineage lead to a significant increase in the patient having prostate cancer. However, his PSA is 4.2 which is only slightly higher than it was 2 years prior.   1. Elevated PSA I discussed the implications of an elevated PSA with the patient as well as the pitfalls of PSA screening. At this point, we have opted to follow with a PSA in 3 months. I did briefly discuss the role of prostate biopsy and reiterated the need for close follow-up for him given his African-American lineage and family history. I splayed to the patient that it would be important for him to refrain from ejaculation/intercourse one week prior to obtaining a PSA. He would also be served well by drinking plenty  of water, at holding onto his urine for long periods of time, and avoiding constipation which all could potentially cause a artificial elevation in PSA. - PSA, total and free   Return in about 3 months (around 04/17/2016).  Crist Fat, MD  [facility] [address] [place], [place] [address] [phone]

## 2016-02-24 DIAGNOSIS — E749 Disorder of carbohydrate metabolism, unspecified: Secondary | ICD-10-CM | POA: Diagnosis not present

## 2016-02-24 DIAGNOSIS — I10 Essential (primary) hypertension: Secondary | ICD-10-CM | POA: Diagnosis not present

## 2016-02-24 DIAGNOSIS — E785 Hyperlipidemia, unspecified: Secondary | ICD-10-CM | POA: Diagnosis not present

## 2016-02-27 DIAGNOSIS — N411 Chronic prostatitis: Secondary | ICD-10-CM | POA: Diagnosis not present

## 2016-02-27 DIAGNOSIS — I10 Essential (primary) hypertension: Secondary | ICD-10-CM | POA: Diagnosis not present

## 2016-03-27 DIAGNOSIS — H40003 Preglaucoma, unspecified, bilateral: Secondary | ICD-10-CM | POA: Diagnosis not present

## 2016-05-03 ENCOUNTER — Other Ambulatory Visit: Payer: Self-pay

## 2016-05-03 DIAGNOSIS — R972 Elevated prostate specific antigen [PSA]: Secondary | ICD-10-CM

## 2016-05-04 ENCOUNTER — Other Ambulatory Visit: Payer: 59

## 2016-05-04 DIAGNOSIS — R972 Elevated prostate specific antigen [PSA]: Secondary | ICD-10-CM

## 2016-05-05 LAB — PSA: Prostate Specific Ag, Serum: 4.9 ng/mL — ABNORMAL HIGH (ref 0.0–4.0)

## 2016-05-09 ENCOUNTER — Telehealth: Payer: Self-pay

## 2016-05-09 NOTE — Telephone Encounter (Signed)
LMOM

## 2016-05-09 NOTE — Telephone Encounter (Signed)
-----   Message from Crist FatBenjamin W Herrick, MD sent at 05/08/2016  5:26 PM EDT ----- PSA slightly higher then previously, this sholud be followed up by clinic visit to discuss implications.

## 2016-05-10 NOTE — Telephone Encounter (Signed)
Spoke with pt in reference to PSA results and needing a f/u appt. Pt voiced understanding stating he has an appt on Monday.

## 2016-05-14 ENCOUNTER — Ambulatory Visit (INDEPENDENT_AMBULATORY_CARE_PROVIDER_SITE_OTHER): Payer: 59 | Admitting: Urology

## 2016-05-14 ENCOUNTER — Encounter: Payer: Self-pay | Admitting: Urology

## 2016-05-14 VITALS — BP 142/76 | HR 56 | Ht 72.0 in | Wt 210.0 lb

## 2016-05-14 DIAGNOSIS — R972 Elevated prostate specific antigen [PSA]: Secondary | ICD-10-CM

## 2016-05-14 NOTE — Progress Notes (Signed)
05/14/2016 3:54 PM   Pedro Robinson 10/18/1945 [id number]  Referring provider: Sherron Monday, MD [address] [place], [place] [address]  Chief Complaint  Patient presents with  . Follow-up    elevated PSA    HPI: 70 year old male who is referred for further evaluation and management of an elevated PSA. This was obtained as part of the patient's annual history and physical. The patient was noted to have a PSA of 4.6 and was given a ten-day course of antibiotics followed by repeat PSA which dropped to 4.2. The patient denies any significant progression of his voiding symptoms. He denies any frequency, urgency, or incontinence. He does describe difficulty voiding while taking over-the-counter cough medications for which he was taking several weeks ago. The patient does not family history of prostate cancer, his brother was diagnosed with widely metastatic lung cancer at the age of 55 and eventually died of his disease. The patient denies any gross hematuria or dysuria. He has no history of recurrent urinary tract infections.  PSA:  4.9 on 05/04/16 4.2 on 12/21/15 4.6 on 11/25/15 3.5 on 07/2014 3.5 on 05/2013    IPSS: 1, QoL 1 SHIM: 23  PMH: Past Medical History:  Diagnosis Date  . Gout   . Hyperlipemia   . Hypertension     Surgical History: Past Surgical History:  Procedure Laterality Date  . left foot      Home Medications:    Medication List       Accurate as of 05/14/16  3:54 PM. Always use your most recent med list.          aspirin 81 MG tablet Take 81 mg by mouth daily.   Fish Oil 1000 MG Caps Take by mouth.   fluticasone 27.5 MCG/SPRAY nasal spray Commonly known as:  VERAMYST Place 2 sprays into the nose daily.   hydrALAZINE 50 MG tablet Commonly known as:  APRESOLINE   meloxicam 15 MG tablet Commonly known as:  MOBIC Take 1 tablet (15 mg total) by mouth daily.   multivitamin with minerals tablet Take 1 tablet by mouth daily.   OSTEO  BI-FLEX ADV DOUBLE ST Caps Take by mouth.   rosuvastatin 5 MG tablet Commonly known as:  CRESTOR   TRIBENZOR 40-10-25 MG Tabs Generic drug:  Olmesartan-Amlodipine-HCTZ Take by mouth.       Allergies:  Allergies  Allergen Reactions  . Ivp Dye [Iodinated Diagnostic Agents] Nausea And Vomiting    Hot flashes     Family History: Family History  Problem Relation Age of Onset  . Prostate cancer Brother   . Kidney failure Mother   . Bladder Cancer Neg Hx   . Kidney cancer Neg Hx   . Lung cancer Brother     Social History:  reports that he has never smoked. He has never used smokeless tobacco. He reports that he does not drink alcohol or use drugs.  ROS: UROLOGY Frequent Urination?: No Hard to postpone urination?: No Burning/pain with urination?: No Get up at night to urinate?: No Leakage of urine?: No Urine stream starts and stops?: No Trouble starting stream?: No Do you have to strain to urinate?: No Blood in urine?: No Urinary tract infection?: No Sexually transmitted disease?: No Injury to kidneys or bladder?: No Painful intercourse?: No Weak stream?: No Erection problems?: No Penile pain?: No  Gastrointestinal Nausea?: No Vomiting?: No Indigestion/heartburn?: No Diarrhea?: No Constipation?: No  Constitutional Fever: No Night sweats?: No Weight loss?: No Fatigue?: No  Skin  Skin rash/lesions?: No Itching?: No  Eyes Blurred vision?: No Double vision?: No  Ears/Nose/Throat Sore throat?: No Sinus problems?: No  Hematologic/Lymphatic Swollen glands?: No Easy bruising?: No  Cardiovascular Leg swelling?: No Chest pain?: No  Respiratory Cough?: No Shortness of breath?: No  Endocrine Excessive thirst?: No  Musculoskeletal Back pain?: No Joint pain?: No  Neurological Headaches?: No Dizziness?: No  Psychologic Depression?: No Anxiety?: No  Physical Exam: BP (!) 142/76   Pulse (!) 56   Ht 6' (1.829 m)   Wt 95.3 kg (210 lb)    BMI 28.48 kg/m   Constitutional:  Alert and oriented, No acute distress. HEENT: [place] AT, moist mucus membranes.  Trachea midline, no masses. Cardiovascular: No clubbing, cyanosis, or edema. Respiratory: Normal respiratory effort, no increased work of breathing. GI: Abdomen is soft, nontender, nondistended, no abdominal masses GU: No CVA tenderness.  DRE: 01/2016:  prostate was +2 in size, symmetric, no nodules, nontender Skin: No rashes, bruises or suspicious lesions. Lymph: No cervical or inguinal adenopathy. Neurologic: Grossly intact, no focal deficits, moving all 4 extremities. Psychiatric: Normal mood and affect.  Laboratory Data: No results found for: WBC, HGB, HCT, MCV, PLT  No results found for: CREATININE  No results found for: PSA  No results found for: TESTOSTERONE  No results found for: HGBA1C  Urinalysis No results found for: COLORURINE, APPEARANCEUR, LABSPEC, PHURINE, GLUCOSEU, HGBUR, BILIRUBINUR, KETONESUR, PROTEINUR, UROBILINOGEN, NITRITE, LEUKOCYTESUR  Pertinent Imaging:   Assessment & Plan:  The patient has an elevated PSA, a history of prostate cancer within his family, as well as African-American lineage lead to a significant increase in the patient having prostate cancer.  PSA is slightly elevated over the past 5 months, but are the Increase or rise has been slow overall. 1. Elevated PSA We again discussed the implications of an elevated/rising PSA. Patient's PSA velocity is fairly low at this point. As such, I recommended that we follow up in 6 months with a PSA.  - PSA, total and free   Return in about 6 months (around 11/14/2016) for with PSA prior.  Crist FatHERRICK, Jousha Schwandt W, MD  [facility] [address], [address] [place], [place] [address] [phone]

## 2016-06-06 DIAGNOSIS — E785 Hyperlipidemia, unspecified: Secondary | ICD-10-CM | POA: Diagnosis not present

## 2016-06-06 DIAGNOSIS — I10 Essential (primary) hypertension: Secondary | ICD-10-CM | POA: Diagnosis not present

## 2016-06-06 DIAGNOSIS — N400 Enlarged prostate without lower urinary tract symptoms: Secondary | ICD-10-CM | POA: Diagnosis not present

## 2016-06-11 DIAGNOSIS — E785 Hyperlipidemia, unspecified: Secondary | ICD-10-CM | POA: Diagnosis not present

## 2016-06-11 DIAGNOSIS — N400 Enlarged prostate without lower urinary tract symptoms: Secondary | ICD-10-CM | POA: Diagnosis not present

## 2016-06-11 DIAGNOSIS — E749 Disorder of carbohydrate metabolism, unspecified: Secondary | ICD-10-CM | POA: Diagnosis not present

## 2016-11-15 ENCOUNTER — Ambulatory Visit: Payer: 59

## 2016-11-27 ENCOUNTER — Ambulatory Visit: Payer: 59 | Admitting: Urology

## 2016-12-11 ENCOUNTER — Other Ambulatory Visit: Payer: Self-pay

## 2016-12-11 ENCOUNTER — Encounter: Payer: Self-pay | Admitting: Urology

## 2016-12-11 ENCOUNTER — Ambulatory Visit (INDEPENDENT_AMBULATORY_CARE_PROVIDER_SITE_OTHER): Payer: 59 | Admitting: Urology

## 2016-12-11 VITALS — BP 190/97 | HR 67 | Ht 72.0 in | Wt 205.5 lb

## 2016-12-11 DIAGNOSIS — R972 Elevated prostate specific antigen [PSA]: Secondary | ICD-10-CM

## 2016-12-11 NOTE — Progress Notes (Signed)
12/11/2016 2:33 PM   Hoover BrunetteClifton B Knittle [birth year] [id number]  Referring provider: Sherron MondayS Ahmed Tejan-Sie, MD [address] [place], [place] [id number]  Chief Complaint  Patient presents with  . Follow-up    elevated PSA     HPI: 71 year old male who is referred for further evaluation and management of an elevated PSA. This was obtained as part of the patient's annual history and physical. The patient was noted to have a PSA of 4.6 and was given a ten-day course of antibiotics followed by repeat PSA which dropped to 4.2. The patient denies any significant progression of his voiding symptoms. He denies any frequency, urgency, or incontinence. He does describe difficulty voiding while taking over-the-counter cough medications for which he was taking several weeks ago.   The patient does not family history of prostate cancer, his brother was diagnosed with widely metastatic lung cancer at the age of 71 and eventually died of his disease. The patient denies any gross hematuria or dysuria. He has no history of recurrent urinary tract infections.  PSA:  4.9 on 09/19/16 4.9 on 05/04/16 4.2 on 12/21/15 4.6 on 11/25/15 3.5 on 07/2014 3.5 on 05/2013    IPSS: 1, QoL 1 SHIM: 23  Intv: The patient follows up today for follow-up of an elevated PSA. He had a PSA drawn by his primary care provider which is 4.9 January. He denies any progression of his voiding symptoms. Specifically, he denies any gross hematuria, dysuria, or worsening urge/frequency. The patient has had no trouble with his bowels. His erections are stable. His voiding symptoms are unchanged.  PMH: Past Medical History:  Diagnosis Date  . Gout   . Hyperlipemia   . Hypertension     Surgical History: Past Surgical History:  Procedure Laterality Date  . left foot      Home Medications:  Allergies as of 12/11/2016      Reactions   Ivp Dye [iodinated Diagnostic Agents] Nausea And Vomiting   Hot flashes       Medication List         Accurate as of 12/11/16  2:33 PM. Always use your most recent med list.          aspirin 81 MG tablet Take 81 mg by mouth daily.   Fish Oil 1000 MG Caps Take by mouth.   fluticasone 27.5 MCG/SPRAY nasal spray Commonly known as:  VERAMYST Place 2 sprays into the nose daily.   hydrALAZINE 50 MG tablet Commonly known as:  APRESOLINE   meloxicam 15 MG tablet Commonly known as:  MOBIC Take 1 tablet (15 mg total) by mouth daily.   multivitamin with minerals tablet Take 1 tablet by mouth daily.   OSTEO BI-FLEX ADV DOUBLE ST Caps Take by mouth.   rosuvastatin 5 MG tablet Commonly known as:  CRESTOR   TRIBENZOR 40-10-25 MG Tabs Generic drug:  Olmesartan-Amlodipine-HCTZ Take by mouth.       Allergies:  Allergies  Allergen Reactions  . Ivp Dye [Iodinated Diagnostic Agents] Nausea And Vomiting    Hot flashes     Family History: Family History  Problem Relation Age of Onset  . Prostate cancer Brother   . Kidney failure Mother   . Bladder Cancer Neg Hx   . Kidney cancer Neg Hx   . Lung cancer Brother     Social History:  reports that he has never smoked. He has never used smokeless tobacco. He reports that he does not drink alcohol or use drugs.  ROS:  UROLOGY Frequent Urination?: No Hard to postpone urination?: No Burning/pain with urination?: No Get up at night to urinate?: No Leakage of urine?: No Urine stream starts and stops?: No Trouble starting stream?: No Do you have to strain to urinate?: No Blood in urine?: No Urinary tract infection?: No Sexually transmitted disease?: No Injury to kidneys or bladder?: No Painful intercourse?: No Weak stream?: No Erection problems?: No Penile pain?: No  Gastrointestinal Nausea?: No Vomiting?: No Indigestion/heartburn?: No Diarrhea?: No Constipation?: No  Constitutional Fever: No Night sweats?: No Weight loss?: No Fatigue?: No  Skin Skin rash/lesions?: No Itching?: No  Eyes Blurred vision?:  No Double vision?: No  Ears/Nose/Throat Sore throat?: No Sinus problems?: No  Hematologic/Lymphatic Swollen glands?: No Easy bruising?: No  Cardiovascular Leg swelling?: No Chest pain?: No  Respiratory Cough?: No Shortness of breath?: No  Endocrine Excessive thirst?: No  Musculoskeletal Back pain?: No Joint pain?: No  Neurological Headaches?: No Dizziness?: No  Psychologic Depression?: No Anxiety?: No  Physical Exam: BP (!) 190/97   Pulse 67   Ht 6' (1.829 m)   Wt 93.2 kg (205 lb 8 oz)   BMI 27.87 kg/m   Constitutional:  Alert and oriented, No acute distress. HEENT: [place] AT, moist mucus membranes.  Trachea midline, no masses. Cardiovascular: No clubbing, cyanosis, or edema. Respiratory: Normal respiratory effort, no increased work of breathing. GI: Abdomen is soft, nontender, nondistended, no abdominal masses GU: No CVA tenderness.  DRE: 12/10/16:  prostate was +2 in size, symmetric, no nodules, nontender Skin: No rashes, bruises or suspicious lesions. Lymph: No cervical or inguinal adenopathy. Neurologic: Grossly intact, no focal deficits, moving all 4 extremities. Psychiatric: Normal mood and affect.  Laboratory Data: No results found for: WBC, HGB, HCT, MCV, PLT  No results found for: CREATININE  No results found for: PSA  No results found for: TESTOSTERONE  No results found for: HGBA1C  Urinalysis No results found for: COLORURINE, APPEARANCEUR, LABSPEC, PHURINE, GLUCOSEU, HGBUR, BILIRUBINUR, KETONESUR, PROTEINUR, UROBILINOGEN, NITRITE, LEUKOCYTESUR  Pertinent Imaging:   Assessment & Plan:  The patient has an elevated PSA although it has been stable/very slowly rising over the past several years. His rectal exam is reassuring. He has no family history.  1. Elevated PSA Again, we discussed the implications of an elevated PSA. Fortunately, his PSA has been relatively stable. Today we discussed the option of obtaining a 4 K score which would give  [place] the risk of him being diagnosed with intermediate/high grade prostate cancer. This may help guide our decision as to whether to proceed with biopsy. However, since his PSA has not changed significantly over the interval we would otherwise plan to follow up in 6 months. The patient is due for day have a PSA drawn by his primary care doctor within the next several weeks. We will continue some monitor his PSA closely, but defer any biopsy for at least 6 months at this time.  If the patient is able to obtain 4 K score based on insurance coverage, I would like this to be obtained in the near future so that we can disseminate this information to him and hopefully make a more educated decision in the next 6 months as to whether or not to proceed with the biopsy.   No Follow-up on file.  Crist Fat, MD  [facility] [address] [place], [place] [phone]

## 2017-05-23 ENCOUNTER — Other Ambulatory Visit: Payer: 59

## 2017-05-23 ENCOUNTER — Other Ambulatory Visit: Payer: Self-pay

## 2017-05-23 DIAGNOSIS — R972 Elevated prostate specific antigen [PSA]: Secondary | ICD-10-CM

## 2017-05-24 LAB — PSA: Prostate Specific Ag, Serum: 5.2 ng/mL — ABNORMAL HIGH (ref 0.0–4.0)

## 2017-05-27 ENCOUNTER — Encounter: Payer: Self-pay | Admitting: Urology

## 2017-05-27 ENCOUNTER — Ambulatory Visit (INDEPENDENT_AMBULATORY_CARE_PROVIDER_SITE_OTHER): Payer: 59 | Admitting: Urology

## 2017-05-27 VITALS — BP 130/63 | HR 55 | Ht 72.0 in | Wt 202.1 lb

## 2017-05-27 DIAGNOSIS — R972 Elevated prostate specific antigen [PSA]: Secondary | ICD-10-CM

## 2017-05-27 NOTE — Progress Notes (Signed)
05/27/2017 5:28 PM   Pedro Robinson [birth year] [id number]  Referring provider: Sherron Mondayejan-Sie, S Ahmed, MD [address] [place], [place] [address]  Chief Complaint  Patient presents with  . Elevated PSA    HPI: 71 year old male who is referred for further evaluation and management of an elevated PSA. This was obtained as part of the patient's annual history and physical. The patient was noted to have a PSA of 4.6 and was given a ten-day course of antibiotics followed by repeat PSA which dropped to 4.2. The patient denies any significant progression of his voiding symptoms. He denies any frequency, urgency, or incontinence. He does describe difficulty voiding while taking over-the-counter cough medications for which he was taking several weeks ago.   The patient does not family history of prostate cancer, his brother was diagnosed with widely metastatic lung cancer at the age of 982 and eventually died of his disease. The patient denies any gross hematuria or dysuria. He has no history of recurrent urinary tract infections.  PSA:  5.2 on 05/23/17 4.9 on 09/19/16 4.9 on 05/04/16 4.2 on 12/21/15 4.6 on 11/25/15 3.5 on 07/2014 3.5 on 05/2013    IPSS: 1, QoL 1 SHIM: 23  Intv: The patient presents today for follow-up of an elevated PSA. He denies any progressive voiding symptoms. He denies any hematuria. He denies any bone pain or lower extremity edema. He denies any constipation or GI type symptoms. He feels quite well.   PMH: Past Medical History:  Diagnosis Date  . Gout   . Hyperlipemia   . Hypertension     Surgical History: Past Surgical History:  Procedure Laterality Date  . left foot      Home Medications:  Allergies as of 05/27/2017      Reactions   Ivp Dye [iodinated Diagnostic Agents] Nausea And Vomiting   Hot flashes       Medication List       Accurate as of 05/27/17  5:28 PM. Always use your most recent med list.          aspirin 81 MG tablet Take 81 mg by  mouth daily.   Fish Oil 1000 MG Caps Take by mouth.   fluticasone 27.5 MCG/SPRAY nasal spray Commonly known as:  VERAMYST Place 2 sprays into the nose daily.   hydrALAZINE 50 MG tablet Commonly known as:  APRESOLINE   meloxicam 15 MG tablet Commonly known as:  MOBIC Take 1 tablet (15 mg total) by mouth daily.   multivitamin with minerals tablet Take 1 tablet by mouth daily.   OSTEO BI-FLEX ADV DOUBLE ST Caps Take by mouth.   rosuvastatin 5 MG tablet Commonly known as:  CRESTOR   TRIBENZOR 40-10-25 MG Tabs Generic drug:  Olmesartan-Amlodipine-HCTZ Take by mouth.       Allergies:  Allergies  Allergen Reactions  . Ivp Dye [Iodinated Diagnostic Agents] Nausea And Vomiting    Hot flashes     Family History: Family History  Problem Relation Age of Onset  . Prostate cancer Brother   . Kidney failure Mother   . Bladder Cancer Neg Hx   . Kidney cancer Neg Hx   . Lung cancer Brother     Social History:  reports that he has never smoked. He has never used smokeless tobacco. He reports that he does not drink alcohol or use drugs.  ROS: UROLOGY Frequent Urination?: No Hard to postpone urination?: No Burning/pain with urination?: No Get up at night to urinate?: No Leakage of  urine?: No Urine stream starts and stops?: No Trouble starting stream?: No Do you have to strain to urinate?: No Blood in urine?: No Urinary tract infection?: No Sexually transmitted disease?: No Injury to kidneys or bladder?: No Painful intercourse?: No Weak stream?: No Erection problems?: No Penile pain?: No  Gastrointestinal Nausea?: No Vomiting?: No Indigestion/heartburn?: No Diarrhea?: No Constipation?: No  Constitutional Fever: No Night sweats?: No Weight loss?: No Fatigue?: No  Skin Skin rash/lesions?: No Itching?: No  Eyes Blurred vision?: No Double vision?: No  Ears/Nose/Throat Sore throat?: No Sinus problems?: No  Hematologic/Lymphatic Swollen glands?:  No Easy bruising?: No  Cardiovascular Leg swelling?: No Chest pain?: No  Respiratory Cough?: No Shortness of breath?: No  Endocrine Excessive thirst?: No  Musculoskeletal Back pain?: No Joint pain?: No  Neurological Headaches?: No Dizziness?: No  Psychologic Depression?: No Anxiety?: No  Physical Exam: BP 130/63 (BP Location: Left Arm, Patient Position: Sitting, Cuff Size: Normal)   Pulse (!) 55   Ht 6' (1.829 m)   Wt 91.7 kg (202 lb 1.6 oz)   BMI 27.41 kg/m   Constitutional:  Alert and oriented, No acute distress. HEENT: [place] AT, moist mucus membranes.  Trachea midline, no masses. Cardiovascular: No clubbing, cyanosis, or edema. Respiratory: Normal respiratory effort, no increased work of breathing. GI: Abdomen is soft, nontender, nondistended, no abdominal masses GU: No CVA tenderness.  DRE: 12/10/16:  prostate was +2 in size, symmetric, no nodules, nontender Skin: No rashes, bruises or suspicious lesions. Lymph: No cervical or inguinal adenopathy. Neurologic: Grossly intact, no focal deficits, moving all 4 extremities. Psychiatric: Normal mood and affect.  Laboratory Data: No results found for: WBC, HGB, HCT, MCV, PLT  No results found for: CREATININE  No results found for: PSA  No results found for: TESTOSTERONE  No results found for: HGBA1C  Urinalysis No results found for: COLORURINE, APPEARANCEUR, LABSPEC, PHURINE, GLUCOSEU, HGBUR, BILIRUBINUR, KETONESUR, PROTEINUR, UROBILINOGEN, NITRITE, LEUKOCYTESUR  Pertinent Imaging:   Assessment & Plan:  The patient's PSA continues to rise, albeit very slowly. History and remains concerning to me. As such I have recommended that he consider prostate biopsy.  1. Elevated PSA Plan is to proceed with transrectal ultrasound and prostate biopsy. I have detailed the procedure for him and gone over the associated risk of infection. He understands that he will likely have some blood in his urine and stool for a few  days up to 2 weeks. He also understands that he is likely to have blood in his ejaculate for about a month. We'll try to get this scheduled for him at his convenience.   Return in about 1 month (around 06/26/2017) for prostate biospy.  Crist Fat, MD  [facility] [address], [address] [place], [place] [address] [phone]

## 2017-07-02 ENCOUNTER — Ambulatory Visit (INDEPENDENT_AMBULATORY_CARE_PROVIDER_SITE_OTHER): Payer: 59 | Admitting: Urology

## 2017-07-02 ENCOUNTER — Other Ambulatory Visit: Payer: Self-pay | Admitting: Urology

## 2017-07-02 VITALS — BP 171/78 | HR 53 | Ht 72.0 in | Wt 202.0 lb

## 2017-07-02 DIAGNOSIS — R972 Elevated prostate specific antigen [PSA]: Secondary | ICD-10-CM

## 2017-07-02 MED ORDER — LEVOFLOXACIN 500 MG PO TABS
500.0000 mg | ORAL_TABLET | Freq: Once | ORAL | Status: AC
  Administered 2017-07-02: 500 mg via ORAL

## 2017-07-02 MED ORDER — GENTAMICIN SULFATE 40 MG/ML IJ SOLN
80.0000 mg | Freq: Once | INTRAMUSCULAR | Status: AC
  Administered 2017-07-02: 80 mg via INTRAMUSCULAR

## 2017-07-02 NOTE — Progress Notes (Signed)
He returns for biopsy. He's been well. He has no dysuria or gross hematuria. His PSA is 5.2 and has risen from 3.5 since September 2014.  Prostate Biopsy Procedure   Informed consent was obtained after discussing risks/benefits of the procedure.  A time out was performed to ensure correct patient identity.  Pre-Procedure: - Last PSA Level: No results found for: PSA - Gentamicin given prophylactically - Levaquin 500 mg administered PO -DRE - normal exam -Transrectal Ultrasound performed revealing a 81.57 g prostate, prominent median lobe.  -No significant hypoechoic or median lobe noted  Procedure: - Prostate block performed using 10 cc 1% lidocaine and biopsies taken from sextant areas, a total of 12 under ultrasound guidance.  Post-Procedure: - Patient tolerated the procedure well - He was counseled to seek immediate medical attention if experiences any severe pain, significant bleeding, or fevers - Return in one week to discuss biopsy results

## 2017-07-09 LAB — PATHOLOGY REPORT

## 2017-07-12 ENCOUNTER — Other Ambulatory Visit: Payer: Self-pay | Admitting: Urology

## 2017-07-12 ENCOUNTER — Telehealth: Payer: Self-pay | Admitting: *Deleted

## 2017-07-12 DIAGNOSIS — R972 Elevated prostate specific antigen [PSA]: Secondary | ICD-10-CM

## 2017-07-12 NOTE — Telephone Encounter (Signed)
Spoke with patient and gave results. Patient will follow up in 6 months with labs prior. Patient states does he need to come to the appointment next Tuesday then because the appointment was to dicuss results.  I let him know that I would send the Doctor a message and find out and call him back.

## 2017-07-12 NOTE — Telephone Encounter (Signed)
-----   Message from Jerilee FieldMatthew Eskridge, MD sent at 07/11/2017  4:33 PM EDT ----- Notify patient his prostate biopsy was negative -- no prostate cancer was found. He should return to see us in 6 months with a PSA prior.    ----- Message ----- From: Ellin GoodieLowe, Casandra S, CMA Sent: 07/09/2017   2:48 PM To: Jerilee FieldMatthew Eskridge, MD    ----- Message ----- From: Interface, [facility] Lab Results In Sent: 07/09/2017  12:33 PM To: Jennette KettleBua [facility]

## 2017-07-15 NOTE — Telephone Encounter (Signed)
Per Dr. Mena GoesEskridge patient does not need to keep appointment for Tuesday to follow up in 6 months with psa prior. Patient understands and transferred to Glenice BowLisa Hartley to make appointment. Future psa ordered.

## 2017-07-16 ENCOUNTER — Ambulatory Visit: Payer: 59

## 2018-01-13 ENCOUNTER — Other Ambulatory Visit: Payer: BLUE CROSS/BLUE SHIELD

## 2018-01-13 DIAGNOSIS — R972 Elevated prostate specific antigen [PSA]: Secondary | ICD-10-CM

## 2018-01-14 ENCOUNTER — Encounter: Payer: Self-pay | Admitting: Family Medicine

## 2018-01-14 ENCOUNTER — Telehealth: Payer: Self-pay | Admitting: Family Medicine

## 2018-01-14 LAB — PSA: PROSTATE SPECIFIC AG, SERUM: 6.4 ng/mL — AB (ref 0.0–4.0)

## 2018-01-14 NOTE — Telephone Encounter (Signed)
-----   Message from Jerilee Field, MD sent at 01/14/2018 10:08 AM EDT ----- Notify patient -- PSA elevated and has slightly risen -- give result -- keep f/u as planned   ----- Message ----- From: Honor Loh, CMA Sent: 01/14/2018   8:52 AM To: Jerilee Field, MD    ----- Message ----- From: Interface, [facility] Lab Results In Sent: 01/14/2018   5:40 AM To: Jennette Kettle [facility]

## 2018-01-14 NOTE — Telephone Encounter (Signed)
Letter sent.

## 2018-01-20 ENCOUNTER — Encounter: Payer: Self-pay | Admitting: Urology

## 2018-01-20 ENCOUNTER — Ambulatory Visit (INDEPENDENT_AMBULATORY_CARE_PROVIDER_SITE_OTHER): Payer: BLUE CROSS/BLUE SHIELD | Admitting: Urology

## 2018-01-20 VITALS — BP 164/65 | HR 58 | Resp 16 | Ht 72.0 in | Wt 206.4 lb

## 2018-01-20 DIAGNOSIS — R972 Elevated prostate specific antigen [PSA]: Secondary | ICD-10-CM | POA: Diagnosis not present

## 2018-01-20 DIAGNOSIS — N400 Enlarged prostate without lower urinary tract symptoms: Secondary | ICD-10-CM

## 2018-01-20 NOTE — Progress Notes (Signed)
01/20/2018 8:50 AM   Pedro Robinson April 11, 1946 [id number]  Referring provider: Sherron Monday, MD [address] [place], [place] [address]  No chief complaint on file.   HPI:  72 year old male who is referred for further evaluation and management of an elevated PSA. The patient was noted to have a PSA of 4.6 and was given a ten-day course of antibiotics followed by repeat PSA which dropped to 4.2.  The patient does not family history of prostate cancer, his brother was diagnosed with widely metastatic lung cancer at the age of 36 and eventually died of his disease.   PSA/biopsy:  6.4 on 01/13/2018 Biopsy on 07/02/2017 - 82 gram prostate, benign with chronic inflammation  5.2 on 05/23/17 4.9 on 09/19/16 4.9 on 05/04/16 4.2 on 12/21/15 4.6 on 11/25/15 - ten day course abx 3.5 on 07/2014 3.5 on 05/2013    IPSS: 1, QoL 1 --  He does describe difficulty voiding while taking over-the-counter cough medications. He has no history of recurrent urinary tract infections. SHIM: 23  Pt returns in continued management of above.  He underwent biopsy last fall.  Current PSA is 6.4 with a PSAD of 0.08. He's had bronchitis and wondered if being sick could alter the PSA. No dysuria or gross hematuria. He voids without difficulty.    PMH: Past Medical History:  Diagnosis Date  . Gout   . Hyperlipemia   . Hypertension     Surgical History: Past Surgical History:  Procedure Laterality Date  . left foot      Home Medications:  Allergies as of 01/20/2018      Reactions   Ivp Dye [iodinated Diagnostic Agents] Nausea And Vomiting   Hot flashes       Medication List        Accurate as of 01/20/18  8:50 AM. Always use your most recent med list.          aspirin 81 MG tablet Take 81 mg by mouth daily.   Fish Oil 1000 MG Caps Take by mouth.   fluticasone 27.5 MCG/SPRAY nasal spray Commonly known as:  VERAMYST Place 2 sprays into the nose daily.   hydrALAZINE 50 MG  tablet Commonly known as:  APRESOLINE   meloxicam 15 MG tablet Commonly known as:  MOBIC Take 1 tablet (15 mg total) by mouth daily.   multivitamin with minerals tablet Take 1 tablet by mouth daily.   OSTEO BI-FLEX ADV DOUBLE ST Caps Take by mouth.   rosuvastatin 5 MG tablet Commonly known as:  CRESTOR   TRIBENZOR 40-10-25 MG Tabs Generic drug:  Olmesartan-amLODIPine-HCTZ Take by mouth.       Allergies:  Allergies  Allergen Reactions  . Ivp Dye [Iodinated Diagnostic Agents] Nausea And Vomiting    Hot flashes     Family History: Family History  Problem Relation Age of Onset  . Prostate cancer Brother   . Kidney failure Mother   . Bladder Cancer Neg Hx   . Kidney cancer Neg Hx   . Lung cancer Brother     Social History:  reports that he has never smoked. He has never used smokeless tobacco. He reports that he does not drink alcohol or use drugs.  ROS:                                        Physical Exam: There were no vitals taken  for this visit.  Constitutional:  Alert and oriented, No acute distress. HEENT: [place] AT, moist mucus membranes.  Trachea midline, no masses. Cardiovascular: No clubbing, cyanosis, or edema. Respiratory: Normal respiratory effort, no increased work of breathing. GI: Abdomen is soft, nontender, nondistended, no abdominal masses GU: No CVA tenderness Skin: No rashes, bruises or suspicious lesions. Neurologic: Grossly intact, no focal deficits, moving all 4 extremities. Psychiatric: Normal mood and affect.  Laboratory Data: No results found for: WBC, HGB, HCT, MCV, PLT  No results found for: CREATININE  No results found for: PSA  No results found for: TESTOSTERONE  No results found for: HGBA1C  Urinalysis No results found for: COLORURINE, APPEARANCEUR, LABSPEC, PHURINE, GLUCOSEU, HGBUR, BILIRUBINUR, KETONESUR, PROTEINUR, UROBILINOGEN, NITRITE, LEUKOCYTESUR  No results found for: LABMICR, WBCUA, RBCUA,  LABEPIT, MUCUS, BACTERIA  No results found for this or any previous visit. No results found for this or any previous visit. No results found for this or any previous visit. No results found for this or any previous visit. No results found for this or any previous visit. No results found for this or any previous visit. No results found for this or any previous visit. No results found for this or any previous visit.  Assessment & Plan:    Elevated PSA - discussed nature r/b of other lab tests, MRI and repeat bx. Also, continued surveillance. Wll repeat PSA and if remains high set up for MRI and if he can't tolerate MRI consider repeat bx.   No follow-ups on file.  Jerilee Field, MD  [facility] [address] [place], [place] [address] [phone]

## 2018-02-11 ENCOUNTER — Other Ambulatory Visit: Payer: BLUE CROSS/BLUE SHIELD

## 2018-02-11 DIAGNOSIS — R972 Elevated prostate specific antigen [PSA]: Secondary | ICD-10-CM

## 2018-02-12 LAB — PSA, TOTAL AND FREE
PSA, Free Pct: 27.6 %
PSA, Free: 1.49 ng/mL
Prostate Specific Ag, Serum: 5.4 ng/mL — ABNORMAL HIGH (ref 0.0–4.0)

## 2018-02-13 ENCOUNTER — Telehealth: Payer: Self-pay

## 2018-02-13 DIAGNOSIS — R972 Elevated prostate specific antigen [PSA]: Secondary | ICD-10-CM

## 2018-02-13 NOTE — Telephone Encounter (Signed)
-----   Message from Jerilee Field, MD sent at 02/12/2018  4:45 PM EDT ----- Notify patient -- PSA is lower and closer to baseline although PSA remains elevated -- recheck in 3 months to ensure it remains stable or decreases. Thanks.   ----- Message ----- From: Vickki Hearing, CMA Sent: 02/12/2018   8:39 AM To: Jerilee Field, MD    ----- Message ----- From: Nell Range Lab Results In Sent: 02/12/2018   5:40 AM To: Jennette Kettle [facility]

## 2018-02-13 NOTE — Telephone Encounter (Signed)
Unable to leave message. Answering machine seemed broke. Will try again

## 2018-02-18 ENCOUNTER — Telehealth: Payer: Self-pay | Admitting: Urology

## 2018-02-18 NOTE — Telephone Encounter (Signed)
Pt informed, please call and schedule 3 month lab for psa. Orders placed.

## 2018-02-18 NOTE — Addendum Note (Signed)
Addended by: Vickki HearingHOMPSON, CHRISTOPHER L on: 02/18/2018 09:47 AM   Modules accepted: Orders

## 2018-02-18 NOTE — Telephone Encounter (Signed)
Patient has a follow up app already scheduled

## 2018-07-16 ENCOUNTER — Other Ambulatory Visit: Payer: BLUE CROSS/BLUE SHIELD

## 2018-07-16 DIAGNOSIS — R972 Elevated prostate specific antigen [PSA]: Secondary | ICD-10-CM

## 2018-07-17 LAB — PSA: Prostate Specific Ag, Serum: 5 ng/mL — ABNORMAL HIGH (ref 0.0–4.0)

## 2018-07-21 ENCOUNTER — Encounter: Payer: Self-pay | Admitting: Urology

## 2018-07-21 ENCOUNTER — Ambulatory Visit (INDEPENDENT_AMBULATORY_CARE_PROVIDER_SITE_OTHER): Payer: BLUE CROSS/BLUE SHIELD | Admitting: Urology

## 2018-07-21 VITALS — BP 163/81 | HR 56 | Ht 72.0 in | Wt 195.8 lb

## 2018-07-21 DIAGNOSIS — R972 Elevated prostate specific antigen [PSA]: Secondary | ICD-10-CM

## 2018-07-21 NOTE — Progress Notes (Signed)
07/21/2018 5:21 PM   Pedro Robinson [birth year] [id number]  Referring provider: Sherron Monday, MD [address] [place], [place] [address]  Chief Complaint  Patient presents with  . Elevated PSA   Urologic history: 1.  Elevated PSA -Biopsy October 2018 PSA 5.2; 82 g prostate; benign pathology -PSA April 2019 increased 6.4 with repeat back to baseline at 39.85  HPI: 72 year old male presents for follow-up of an elevated PSA.  He states he is doing well and has no complaints.  He has no bothersome lower urinary tract symptoms.  Denies dysuria or gross hematuria.  Denies flank, abdominal, pelvic or scrotal pain.  A repeat PSA on 07/16/2018 was 5.0.   PMH: Past Medical History:  Diagnosis Date  . Gout   . Hyperlipemia   . Hypertension     Surgical History: Past Surgical History:  Procedure Laterality Date  . left foot      Home Medications:  Allergies as of 07/21/2018      Reactions   Ivp Dye [iodinated Diagnostic Agents] Nausea And Vomiting   Hot flashes       Medication List        Accurate as of 07/21/18  5:21 PM. Always use your most recent med list.          aspirin 81 MG tablet Take 81 mg by mouth daily.   diclofenac sodium 1 % Gel Commonly known as:  VOLTAREN APPLY 4 G THREE TIMES A DAY AS NEEDED FOR PAIN   Fish Oil 1000 MG Caps Take by mouth.   fluticasone 27.5 MCG/SPRAY nasal spray Commonly known as:  VERAMYST Place 2 sprays into the nose daily.   hydrALAZINE 50 MG tablet Commonly known as:  APRESOLINE   meloxicam 15 MG tablet Commonly known as:  MOBIC Take 1 tablet (15 mg total) by mouth daily.   methylPREDNISolone 4 MG Tbpk tablet Commonly known as:  MEDROL DOSEPAK TAKE 6 TABLETS ON DAY 1 AS DIRECTED ON PACKAGE AND DECREASE BY 1 TAB EACH DAY FOR A TOTAL OF 6 DAYS   multivitamin with minerals tablet Take 1 tablet by mouth daily.   OSTEO BI-FLEX ADV DOUBLE ST Caps Take by mouth.   rosuvastatin 5 MG tablet Commonly known as:   CRESTOR   traMADol-acetaminophen 37.5-325 MG tablet Commonly known as:  ULTRACET Take 1 tablet by mouth every 6 (six) hours as needed. for pain   TRIBENZOR 40-10-25 MG Tabs Generic drug:  Olmesartan-amLODIPine-HCTZ Take by mouth.       Allergies:  Allergies  Allergen Reactions  . Ivp Dye [Iodinated Diagnostic Agents] Nausea And Vomiting    Hot flashes     Family History: Family History  Problem Relation Age of Onset  . Prostate cancer Brother   . Kidney failure Mother   . Lung cancer Brother   . Bladder Cancer Neg Hx   . Kidney cancer Neg Hx     Social History:  reports that he has never smoked. He has never used smokeless tobacco. He reports that he does not drink alcohol or use drugs.  ROS: UROLOGY Frequent Urination?: No Hard to postpone urination?: No Burning/pain with urination?: No Get up at night to urinate?: No Leakage of urine?: No Urine stream starts and stops?: No Trouble starting stream?: No Do you have to strain to urinate?: No Blood in urine?: No Urinary tract infection?: No Sexually transmitted disease?: No Injury to kidneys or bladder?: No Painful intercourse?: No Weak stream?: No Erection problems?: No Penile  pain?: No  Gastrointestinal Nausea?: No Vomiting?: No Indigestion/heartburn?: No Diarrhea?: No Constipation?: No  Constitutional Fever: No Night sweats?: No Weight loss?: No Fatigue?: No  Skin Skin rash/lesions?: No Itching?: No  Eyes Blurred vision?: No Double vision?: No  Ears/Nose/Throat Sore throat?: No Sinus problems?: No  Hematologic/Lymphatic Swollen glands?: No Easy bruising?: No  Cardiovascular Leg swelling?: No Chest pain?: No  Respiratory Cough?: No Shortness of breath?: No  Endocrine Excessive thirst?: No  Musculoskeletal Back pain?: No Joint pain?: No  Neurological Headaches?: No Dizziness?: No  Psychologic Depression?: No Anxiety?: No  Physical Exam: BP (!) 163/81 (BP Location:  Left Arm, Patient Position: Sitting, Cuff Size: Normal)   Pulse (!) 56   Ht 6' (1.829 m)   Wt 195 lb 12.8 oz (88.8 kg)   BMI 26.56 kg/m   Constitutional:  Alert and oriented, No acute distress. HEENT: [place] AT, moist mucus membranes.  Trachea midline, no masses. Cardiovascular: No clubbing, cyanosis, or edema. Respiratory: Normal respiratory effort, no increased work of breathing. GI: Abdomen is soft, nontender, nondistended, no abdominal masses GU: No CVA tenderness.  Prostate 60 g, smooth without nodules Lymph: No cervical or inguinal lymphadenopathy. Skin: No rashes, bruises or suspicious lesions. Neurologic: Grossly intact, no focal deficits, moving all 4 extremities. Psychiatric: Normal mood and affect.   Assessment & Plan:   72 year old male with an elevated PSA and previous benign prostate biopsy.  His DRE is benign and most recent PSA is within baseline.  I recommended a follow-up PSA in 6 months and PSA/DRE in 1 year.  If he is stable over the next year would move to annual PSA/DRE.  Return in about 1 year (around 07/22/2019) for Recheck, PSA.   [name], MD  [facility] [address] [place], [place] [address] [phone]

## 2018-07-24 ENCOUNTER — Encounter: Payer: Self-pay | Admitting: Urology

## 2018-09-27 ENCOUNTER — Encounter: Payer: Self-pay | Admitting: Gynecology

## 2018-09-27 ENCOUNTER — Ambulatory Visit (INDEPENDENT_AMBULATORY_CARE_PROVIDER_SITE_OTHER): Payer: BLUE CROSS/BLUE SHIELD

## 2018-09-27 ENCOUNTER — Other Ambulatory Visit: Payer: Self-pay

## 2018-09-27 ENCOUNTER — Ambulatory Visit
Admission: EM | Admit: 2018-09-27 | Discharge: 2018-09-27 | Disposition: A | Discharge status: Home / Self Care | Payer: BLUE CROSS/BLUE SHIELD | Attending: Emergency Medicine | Admitting: Emergency Medicine

## 2018-09-27 DIAGNOSIS — M79672 Pain in left foot: Secondary | ICD-10-CM

## 2018-09-27 LAB — BASIC METABOLIC PANEL
Anion gap: 9 (ref 5–15)
BUN: 26 mg/dL — AB (ref 8–23)
CALCIUM: 10.1 mg/dL (ref 8.9–10.3)
CO2: 26 mmol/L (ref 22–32)
CREATININE: 1.28 mg/dL — AB (ref 0.61–1.24)
Chloride: 106 mmol/L (ref 98–111)
GFR calc Af Amer: >60 mL/min (ref >60)
GFR, EST NON AFRICAN AMERICAN: 56 mL/min — AB (ref >60)
GLUCOSE: 96 mg/dL (ref 70–99)
Potassium: 4.4 mmol/L (ref 3.5–5.1)
SODIUM: 141 mmol/L (ref 135–145)

## 2018-09-27 LAB — CBC WITH DIFFERENTIAL/PLATELET
ABS IMMATURE GRANULOCYTES: 0.01 10*3/uL (ref 0.00–0.07)
BASOS PCT: 0 %
Basophils Absolute: 0 10*3/uL (ref 0.0–0.1)
EOS ABS: 0.3 10*3/uL (ref 0.0–0.5)
Eosinophils Relative: 3 %
HCT: 37.6 % — ABNORMAL LOW (ref 39.0–52.0)
Hemoglobin: 12.6 g/dL — ABNORMAL LOW (ref 13.0–17.0)
Immature Granulocytes: 0 %
Lymphocytes Relative: 22 %
Lymphs Abs: 1.9 10*3/uL (ref 0.7–4.0)
MCH: 32.3 pg (ref 26.0–34.0)
MCHC: 33.5 g/dL (ref 30.0–36.0)
MCV: 96.4 fL (ref 80.0–100.0)
MONO ABS: 1 10*3/uL (ref 0.1–1.0)
Monocytes Relative: 11 %
NEUTROS ABS: 5.6 10*3/uL (ref 1.7–7.7)
Neutrophils Relative %: 64 %
PLATELETS: 346 10*3/uL (ref 150–400)
RBC: 3.9 MIL/uL — ABNORMAL LOW (ref 4.22–5.81)
RDW: 12.7 % (ref 11.5–15.5)
WBC: 8.7 10*3/uL (ref 4.0–10.5)
nRBC: 0 % (ref 0.0–0.2)

## 2018-09-27 MED ORDER — CEPHALEXIN 500 MG PO CAPS: 1000.0000 mg | ORAL_CAPSULE | Freq: Two times a day (BID) | ORAL | 0 refills | Status: AC

## 2018-09-27 MED ORDER — TRAMADOL HCL 50 MG PO TABS: 25.0000 mg | ORAL_TABLET | Freq: Four times a day (QID) | ORAL | 0 refills | Status: DC | PRN

## 2018-09-27 MED ORDER — CEPHALEXIN 500 MG PO CAPS: 1000.0000 mg | ORAL_CAPSULE | Freq: Two times a day (BID) | ORAL | Status: DC

## 2018-09-27 NOTE — ED Provider Notes (Signed)
HPI  SUBJECTIVE:  Pedro Robinson is a 73 y.o. male who presents with left foot/heel pain, foot erythema, swelling along the top of his left foot for the past 2 to 3 weeks.  He states that this started off with right knee pain, swelling, erythema followed by right foot pain, which then migrated to the left arch of the foot.  States the pain is now primarily in the heel of his foot.  The knee and right foot pain have resolved.  Patient states that he had an x-ray of his knee done initially which showed osteoarthritis.  He reports fevers of 100.4 at the beginning of the illness, none since.  He states he had some erythema at the knee, but thinks that it started after applying Voltaren gel to it.  He describes the left foot pain as sharp, stabbing, throbbing that is  aggravated with  walking, movement, certain positions, internal/external rotation, dorsiflexion/plantarflexion of his foot.  He has tried ice, a cane, elevation, Voltaren gel, tramadol, Tylenol.  He states that the Voltaren gel helps minimally.  the tramadol, getting his foot into certain positions also helps.  No rash, bruising, numbness or tingling, trauma to his foot.  No change in his physical activity, recent viral illness, known tick bite.  He states that this is not gout pain.  States that his left knee is fine.  He has a past medical history of kidney disease, states that his kidneys work 64%.  He has a history of gout x1 at the MTP,  Hypertension.  No history of diabetes, seizures.  UEK:[id number], Marcelino Freestone, MD   Past Medical History:  Diagnosis Date  . Gout   . Hyperlipemia   . Hypertension     Past Surgical History:  Procedure Laterality Date  . left foot      Family History  Problem Relation Age of Onset  . Prostate cancer Brother   . Kidney failure Mother   . Lung cancer Brother   . Bladder Cancer Neg Hx   . Kidney cancer Neg Hx     Social History   Tobacco Use  . Smoking status: Never Smoker  . Smokeless  tobacco: Never Used  Substance Use Topics  . Alcohol use: No  . Drug use: No    No current facility-administered medications for this encounter.   Current Outpatient Medications:  .  aspirin 81 MG tablet, Take 81 mg by mouth daily., Disp: , Rfl:  .  fluticasone (VERAMYST) 27.5 MCG/SPRAY nasal spray, Place 2 sprays into the nose daily., Disp: , Rfl:  .  hydrALAZINE (APRESOLINE) 50 MG tablet, , Disp: , Rfl: 0 .  Multiple Vitamins-Minerals (MULTIVITAMIN WITH MINERALS) tablet, Take 1 tablet by mouth daily., Disp: , Rfl:  .  Olmesartan-Amlodipine-HCTZ (TRIBENZOR) 40-10-25 MG TABS, Take by mouth., Disp: , Rfl:  .  Omega-3 Fatty Acids (FISH OIL) 1000 MG CAPS, Take by mouth., Disp: , Rfl:  .  rosuvastatin (CRESTOR) 5 MG tablet, , Disp: , Rfl: 0 .  cephALEXin (KEFLEX) 500 MG capsule, Take 2 capsules (1,000 mg total) by mouth 2 (two) times daily for 5 days., Disp: 20 capsule, Rfl: o .  traMADol (ULTRAM) 50 MG tablet, Take 0.5 tablets (25 mg total) by mouth every 6 (six) hours as needed., Disp: 15 tablet, Rfl: 0  Allergies  Allergen Reactions  . Ivp Dye [Iodinated Diagnostic Agents] Nausea And Vomiting    Hot flashes      ROS  As noted in HPI.  Physical Exam  BP 126/83 (BP Location: Left Arm)   Pulse 60   Temp 98.2 F (36.8 C) (Oral)   Resp 16   Ht 6' (1.829 m)   Wt 88.9 kg   SpO2 96%   BMI 26.58 kg/m   Constitutional: Well developed, well nourished, no acute distress Eyes:  EOMI, conjunctiva normal bilaterally HENT: Normocephalic, atraumatic,mucus membranes moist Respiratory: Normal inspiratory effort Cardiovascular: Normal rate GI: nondistended skin: No rash, skin intact Musculoskeletal:  L Ankle no swelling, erythema.  Proximal fibula NT, Distal fibula NT, Medial malleolus NT,  Deltoid ligament medially NT,  Lateral ligaments  tender, ATFL laterally  tender,  Achilles NT.  no soft tissue defects, calcaneus T, distal NVI with baseline sensation / motor to foot intact. +  Ankle pain  with dorsiflexion/plantar flexion. + Pain with inversion/eversion. - bruising.  - squeeze test. Pt able to bear weight in dept.   Left midfoot tender.  Positive erythema, increased temperature, swelling over entire left foot.  Diffuse tenderness over the metatarsals, including base of fifth metatarsal. No bruising. Skin intact everywhere including between the toes. DP 2+. . Sensation grossly intact. Patient able to move all toes actively.  no foot pain with inversion / eversion,  dorsiflexion / plantarflexion. No Tenderness along the plantar fascia. Neurologic: Alert & oriented x 3, no focal neuro deficits Psychiatric: Speech and behavior appropriate   ED Course   Medications - No data to display  Orders Placed This Encounter  Procedures  . DG Foot Complete Left    Standing Status:   Standing    Number of Occurrences:   1    Order Specific Question:   Reason for Exam (SYMPTOM  OR DIAGNOSIS REQUIRED)    Answer:   pain swelling r/o fx  . DG Ankle Complete Left    Standing Status:   Standing    Number of Occurrences:   1    Order Specific Question:   Reason for Exam (SYMPTOM  OR DIAGNOSIS REQUIRED)    Answer:   pain swelling r/o fx  . CBC with Differential    Standing Status:   Standing    Number of Occurrences:   1  . Basic metabolic panel    Standing Status:   Standing    Number of Occurrences:   1    Results for orders placed or performed during the hospital encounter of 09/27/18 (from the past 24 hour(s))  CBC with Differential     Status: Abnormal   Collection Time: 09/27/18  1:51 PM  Result Value Ref Range   WBC 8.7 4.0 - 10.5 K/uL   RBC 3.90 (L) 4.22 - 5.81 MIL/uL   Hemoglobin 12.6 (L) 13.0 - 17.0 g/dL   HCT 16.137.6 (L) 09.639.0 - 04.552.0 %   MCV 96.4 80.0 - 100.0 fL   MCH 32.3 26.0 - 34.0 pg   MCHC 33.5 30.0 - 36.0 g/dL   RDW 40.912.7 81.111.5 - 91.415.5 %   Platelets 346 150 - 400 K/uL   nRBC 0.0 0.0 - 0.2 %   Neutrophils Relative % 64 %   Neutro Abs 5.6 1.7 - 7.7 K/uL    Lymphocytes Relative 22 %   Lymphs Abs 1.9 0.7 - 4.0 K/uL   Monocytes Relative 11 %   Monocytes Absolute 1.0 0.1 - 1.0 K/uL   Eosinophils Relative 3 %   Eosinophils Absolute 0.3 0.0 - 0.5 K/uL   Basophils Relative 0 %   Basophils Absolute 0.0 0.0 - 0.1  K/uL   Immature Granulocytes 0 %   Abs Immature Granulocytes 0.01 0.00 - 0.07 K/uL  Basic metabolic panel     Status: Abnormal   Collection Time: 09/27/18  1:51 PM  Result Value Ref Range   Sodium 141 135 - 145 mmol/L   Potassium 4.4 3.5 - 5.1 mmol/L   Chloride 106 98 - 111 mmol/L   CO2 26 22 - 32 mmol/L   Glucose, Bld 96 70 - 99 mg/dL   BUN 26 (H) 8 - 23 mg/dL   Creatinine, Ser 1.911.28 (H) 0.61 - 1.24 mg/dL   Calcium 47.810.1 8.9 - 29.510.3 mg/dL   GFR calc non Af Amer 56 (L) >60 mL/min   GFR calc Af Amer >60 >60 mL/min   Anion gap 9 5 - 15   Dg Ankle Complete Left  Result Date: 09/27/2018 CLINICAL DATA:  Lateral left ankle pain and swelling for 3 weeks. No reported injury. EXAM: LEFT ANKLE COMPLETE - 3+ VIEW COMPARISON:  None. FINDINGS: No fracture or subluxation. No suspicious focal osseous lesions or osseous erosions. Small Achilles left calcaneal spur. No significant arthropathy in the left ankle. No radiopaque foreign body. IMPRESSION: No acute osseous abnormality.  Small Achilles left calcaneal spur. Electronically Signed   By: Delbert PhenixJason A Poff M.D.   On: 09/27/2018 14:16   Dg Foot Complete Left  Result Date: 09/27/2018 CLINICAL DATA:  Pain and swelling in the left foot and ankle laterally. No reported injury. EXAM: LEFT FOOT - COMPLETE 3+ VIEW COMPARISON:  None. FINDINGS: No fracture. No dislocation. No suspicious focal osseous lesions. Mild osteoarthritis at the first MTP joint. Mild-to-moderate osteoarthritis at the fifth MTP joint. Moderate osteoarthritis at the first tarsometatarsal joint. Small Achilles left calcaneal spur. No radiopaque foreign bodies. IMPRESSION: 1. No acute osseous abnormality. 2. 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 Electronically Signed   By: Delbert PhenixJason A Poff M.D.   On: 09/27/2018 14:15    ED Clinical Impression  Foot pain, left   ED Assessment/Plan  No outside records available for review.  [place] Narcotic database reviewed for this patient, and feel that the risk/benefit ratio today is favorable for proceeding with a prescription for controlled substance.  Last tramadol prescription was for 28 ultracet 32.5/325 on November 2019.  Checking a CBC, BMP for kidney function, left ankle and foot x-ray.   Labs, imaging reviewed.  No leukocytosis.  Slightly elevated BUN/creatinine, crockroft-gault creatinine clearance calculated at 65.59.  No previous BMP for comparison.  Reviewed imaging independently.  No acute osseous abnormalities, small Achilles left calcaneal spur.  Mild to moderate polyarticular osteoarthritis.  See radiology report for full details.   Doubt septic ankle joint.   It is not gout, plantar fasciitis.This could be a foot cellulitis, but I think that this is less likely.   This could be coming from the Voltaren gel use as he states that his knee and other foot were red after he started applying the Voltaren gel to it.  However I will send him home with Keflex 1000 mg p.o. twice daily for 5 days.  We talked about tramadol, he states that it works "okay" for him, we will have him start at 25 mg several times a day and he may increase it to 50 mg as needed.  He was on Ultracet 37.5/325.  We discussed that opiates will increase his fall risk. advised him to use it as sparingly as possible.  we will give him a copy of his labs today, will have him call  his primary care physician or nephrologist and see if he can start some ibuprofen, because he states that this works for him.  He will need to follow-up with his primary care physician in 3 days.  He is to go to the ER if he gets worse.  Discussed labs, imaging, MDM, treatment plan, and plan for follow-up with patient. Discussed sn/sx that  should prompt return to the ED. patient agrees with plan.   Meds ordered this encounter  Medications  . cephALEXin (KEFLEX) 500 MG capsule    Sig: Take 2 capsules (1,000 mg total) by mouth 2 (two) times daily for 5 days.    Dispense:  20 capsule    Refill:  o  . traMADol (ULTRAM) 50 MG tablet    Sig: Take 0.5 tablets (25 mg total) by mouth every 6 (six) hours as needed.    Dispense:  15 tablet    Refill:  0    *This clinic note was created using Scientist, clinical (histocompatibility and immunogenetics). Therefore, there may be occasional mistakes despite careful proofreading.   ?   Domenick Gong, MD 09/27/18 1729

## 2018-09-27 NOTE — Discharge Instructions (Signed)
Start with half a tablet of tramadol and take it with 1 g of Tylenol 3 or 4 times a day.  You may increase it to a whole tablet if the half tablet/1 g of Tylenol is not working for you.  Call your primary care physician or your nephrologist with your labs and see if you can start a short course of low-dose ibuprofen.  Stop the Voltaren gel.  Finish the antibiotics unless your primary care physician tells you to stop.  Follow-up with your doctor in 3 days.  Go to the ER for the signs and symptoms we discussed.

## 2018-09-27 NOTE — ED Triage Notes (Signed)
Per patient c/o left foot pain x couple weeks. Per patient no injury to his left foot

## 2018-10-23 ENCOUNTER — Ambulatory Visit: Payer: BLUE CROSS/BLUE SHIELD | Admitting: Podiatry

## 2018-10-23 ENCOUNTER — Ambulatory Visit (INDEPENDENT_AMBULATORY_CARE_PROVIDER_SITE_OTHER): Payer: BLUE CROSS/BLUE SHIELD

## 2018-10-23 ENCOUNTER — Encounter: Payer: Self-pay | Admitting: Podiatry

## 2018-10-23 VITALS — BP 148/75 | HR 51

## 2018-10-23 DIAGNOSIS — M7662 Achilles tendinitis, left leg: Secondary | ICD-10-CM

## 2018-10-23 DIAGNOSIS — R609 Edema, unspecified: Secondary | ICD-10-CM

## 2018-10-23 DIAGNOSIS — M129 Arthropathy, unspecified: Secondary | ICD-10-CM | POA: Diagnosis not present

## 2018-10-23 NOTE — Progress Notes (Signed)
This patient presents the office with chief complaint of a painful swollen left foot.  He states he is having pain which is worsening over the last 2 months.  Patient states he experiences pain standing and even had to use a cane in the last few months when the pain became severe.  Patient states he was seen by his medical doctor who did a uric acid test which came back.  Patient was then seen on September 27, 2018 and had x-rays of his left ankle taken.  No bony pathology noted.  Patient states he was treated with antibiotics but probably meant anti-inflammatories.  He states he has taken 400 mg of ibuprofen and it has improved.  He points to the area over the center of his left foot as the site of most pain.  Patient has tried self treatment with elevation ice and heat but the problem continues.  He presents the office today for an evaluation of his left foot.  Patient was seen by Dr.  Charlsie Merlesegal in 2015 and treated with a cam walker.  Patient states that he has kidney disease and has limited ability to take certain medications.  Vascular  Dorsalis pedis and posterior tibial pulses are palpable  B/L.  Capillary return  WNL.  Temperature gradient is  WNL.  Skin turgor  WNL  Sensorium  Senn Weinstein monofilament wire  WNL. Normal tactile sensation.  Nail Exam  Patient has normal nails with no evidence of bacterial or fungal infection.  Orthopedic  Exam  Muscle tone and muscle strength  WNL.  No limitations of motion feet  B/L.  No crepitus or joint effusion noted.  Generalized midfoot arthritis left foot.  Palpable pain at Liz-Frank joint left foot.  Pain at the insertion achilles tendon left foot.  Pain noted sesamoid apparatus left foot.    Skin  No open lesions.  Normal skin texture and turgor.  Arthritis left foot.  Achilles tendinitis left foot.  Sesamoiditis left foot.  IE.  X-rays reveal calcification at the insertion of the Achilles tendon left foot.  Arthritis noted at the midfoot left.  Severe  arthritic changes and a breakdown of the first metatarsocuneiform joint left foot is noted.  Patient has an absent tibial sesamoid left foot.  Discussed this condition with this patient.  Told the patient I believe he is experiencing acute pain and swelling due to arthritis in his left midfoot.  This patient was told that he needs to return to the cam walker that was dispensed years ago and to wear the cam walker until his foot pain resolves.  Patient was recommended power step insoles to be worn after he removes his cam walker.  Patient is to return to the office in 10 days for further evaluation and treatment   Helane GuntherGregory Casady Voshell DPM

## 2018-11-03 ENCOUNTER — Ambulatory Visit: Payer: BLUE CROSS/BLUE SHIELD | Admitting: Podiatry

## 2018-11-03 ENCOUNTER — Encounter: Payer: Self-pay | Admitting: Podiatry

## 2018-11-03 DIAGNOSIS — M7662 Achilles tendinitis, left leg: Secondary | ICD-10-CM

## 2018-11-03 DIAGNOSIS — M129 Arthropathy, unspecified: Secondary | ICD-10-CM | POA: Diagnosis not present

## 2018-11-03 NOTE — Progress Notes (Signed)
This patient presents to the office for continued evaluation of his left foot.  He was diagnosed with acute arthritis left foot.  Patient was told to wear his cam walker for 2 weeks.  No medicine was prescribed.  He presents to the office today wearing his shoes and saying he is better.  He says he is 90% improved from last visit.  He also says the powerstep insoles have helped a lot.  He presents to the office for evaluation and treatment.  .Vascular  Dorsalis pedis and posterior tibial pulses are palpable  B/L.  Capillary return  WNL.  Temperature gradient is  WNL.  Skin turgor  WNL  Sensorium  Senn Weinstein monofilament wire  WNL. Normal tactile sensation.  Nail Exam  Patient has normal nails with no evidence of bacterial or fungal infection.  Orthopedic  Exam  Muscle tone and muscle strength  WNL.  No limitations of motion feet  B/L.  No crepitus or joint effusion noted.  Foot type is unremarkable and digits show no abnormalities.  Minimal palpable pain and swelling at Lix-Franks joint left foot.  Skin  No open lesions.  Normal skin texture and turgor.  Arthritis left foot.  ROV.  Discussed this condition with this patient.  Told him to continue wearing his powerstep insoles and use the cam walker if he develops another arthritic flare-up.  Helane Gunther DPM

## 2019-01-21 ENCOUNTER — Other Ambulatory Visit: Payer: BLUE CROSS/BLUE SHIELD

## 2019-01-21 ENCOUNTER — Other Ambulatory Visit: Payer: Self-pay

## 2019-01-21 DIAGNOSIS — R972 Elevated prostate specific antigen [PSA]: Secondary | ICD-10-CM

## 2019-01-22 ENCOUNTER — Telehealth: Payer: Self-pay

## 2019-01-22 LAB — PSA: Prostate Specific Ag, Serum: 6 ng/mL — ABNORMAL HIGH (ref 0.0–4.0)

## 2019-01-22 NOTE — Telephone Encounter (Signed)
-----   Message from [name], MD sent at 01/22/2019  7:18 AM EDT ----- PSA was 6.0.  Keep scheduled November follow-up.

## 2019-01-22 NOTE — Telephone Encounter (Signed)
Patient notified

## 2019-02-23 ENCOUNTER — Other Ambulatory Visit: Payer: Self-pay

## 2019-02-23 ENCOUNTER — Other Ambulatory Visit (INDEPENDENT_AMBULATORY_CARE_PROVIDER_SITE_OTHER): Payer: Self-pay | Admitting: Student

## 2019-02-23 ENCOUNTER — Ambulatory Visit (INDEPENDENT_AMBULATORY_CARE_PROVIDER_SITE_OTHER): Payer: BC Managed Care – PPO

## 2019-02-23 DIAGNOSIS — I10 Essential (primary) hypertension: Secondary | ICD-10-CM

## 2019-07-20 ENCOUNTER — Other Ambulatory Visit: Payer: BC Managed Care – PPO

## 2019-07-20 ENCOUNTER — Other Ambulatory Visit: Payer: Self-pay

## 2019-07-20 DIAGNOSIS — R972 Elevated prostate specific antigen [PSA]: Secondary | ICD-10-CM

## 2019-07-21 LAB — PSA: Prostate Specific Ag, Serum: 7.3 ng/mL — ABNORMAL HIGH (ref 0.0–4.0)

## 2019-07-22 ENCOUNTER — Other Ambulatory Visit: Payer: BLUE CROSS/BLUE SHIELD

## 2019-07-24 ENCOUNTER — Ambulatory Visit: Payer: BLUE CROSS/BLUE SHIELD | Admitting: Urology

## 2019-09-04 ENCOUNTER — Ambulatory Visit (INDEPENDENT_AMBULATORY_CARE_PROVIDER_SITE_OTHER): Payer: BC Managed Care – PPO | Admitting: Urology

## 2019-09-04 ENCOUNTER — Other Ambulatory Visit: Payer: Self-pay

## 2019-09-04 ENCOUNTER — Encounter: Payer: Self-pay | Admitting: Urology

## 2019-09-04 VITALS — BP 146/79 | HR 67 | Ht 72.0 in | Wt 200.0 lb

## 2019-09-04 DIAGNOSIS — N400 Enlarged prostate without lower urinary tract symptoms: Secondary | ICD-10-CM

## 2019-09-04 DIAGNOSIS — R972 Elevated prostate specific antigen [PSA]: Secondary | ICD-10-CM | POA: Diagnosis not present

## 2019-09-04 NOTE — Progress Notes (Signed)
09/04/2019 11:00 AM   Pedro Robinson 01/23/1946 [id number]  Referring provider: Jodi Marble, MD [place],  [place] [address]  Chief Complaint  Patient presents with  . Elevated PSA    Urologic history: 1.  Elevated PSA -Biopsy October 2018 PSA 5.2; 82 g prostate; benign pathology   HPI: 73 y.o. male presents for annual follow-up.  Overall he states he has been doing well.  He denies bothersome lower urinary tract symptoms.  IPSS completed today was 1/35 with a quality of life rated 0/6.  Denies dysuria or gross hematuria.    PSA May 2020 slightly above baseline at 6.0.  Was repeated November 2020 and had increased to 7.3.   PMH: Past Medical History:  Diagnosis Date  . Gout   . Hyperlipemia   . Hypertension     Surgical History: Past Surgical History:  Procedure Laterality Date  . left foot      Home Medications:  Allergies as of 09/04/2019      Reactions   Ivp Dye [iodinated Diagnostic Agents] Nausea And Vomiting   Hot flashes       Medication List       Accurate as of September 04, 2019 11:00 AM. If you have any questions, ask your nurse or doctor.        STOP taking these medications   aspirin 81 MG tablet Stopped by: Abbie Sons, MD   traMADol 50 MG tablet Commonly known as: ULTRAM Stopped by: Abbie Sons, MD   Tribenzor 40-10-25 MG Tabs Generic drug: Olmesartan-amLODIPine-HCTZ Stopped by: Abbie Sons, MD     TAKE these medications   amLODipine-olmesartan 10-40 MG tablet Commonly known as: AZOR Take 1 tablet by mouth daily.   Fish Oil 1000 MG Caps Take by mouth.   fluticasone 27.5 MCG/SPRAY nasal spray Commonly known as: VERAMYST Place 2 sprays into the nose daily.   fluticasone 50 MCG/ACT nasal spray Commonly known as: FLONASE   furosemide 40 MG tablet Commonly known as: LASIX Take by mouth.   hydrALAZINE 50 MG tablet Commonly known as: APRESOLINE   multivitamin with minerals tablet Take  1 tablet by mouth daily.   rosuvastatin 5 MG tablet Commonly known as: CRESTOR       Allergies:  Allergies  Allergen Reactions  . Ivp Dye [Iodinated Diagnostic Agents] Nausea And Vomiting    Hot flashes     Family History: Family History  Problem Relation Age of Onset  . Prostate cancer Brother   . Kidney failure Mother   . Lung cancer Brother   . Bladder Cancer Neg Hx   . Kidney cancer Neg Hx     Social History:  reports that he has never smoked. He has never used smokeless tobacco. He reports that he does not drink alcohol or use drugs.  ROS: UROLOGY Frequent Urination?: No Hard to postpone urination?: No Burning/pain with urination?: No Get up at night to urinate?: No Leakage of urine?: No Urine stream starts and stops?: No Trouble starting stream?: No Do you have to strain to urinate?: No Blood in urine?: No Urinary tract infection?: No Sexually transmitted disease?: No Injury to kidneys or bladder?: No Painful intercourse?: No Weak stream?: No Erection problems?: No Penile pain?: No  Gastrointestinal Nausea?: No Vomiting?: No Indigestion/heartburn?: No Diarrhea?: No Constipation?: No  Constitutional Fever: No Night sweats?: No Weight loss?: No Fatigue?: No  Skin Skin rash/lesions?: No Itching?: No  Eyes Blurred vision?: No Double vision?: No  Ears/Nose/Throat Sore throat?: No Sinus problems?: No  Hematologic/Lymphatic Swollen glands?: No Easy bruising?: No  Cardiovascular Leg swelling?: No Chest pain?: No  Respiratory Cough?: No Shortness of breath?: No  Endocrine Excessive thirst?: No  Musculoskeletal Back pain?: No Joint pain?: No  Neurological Headaches?: No Dizziness?: No  Psychologic Depression?: No Anxiety?: No  Physical Exam: BP (!) 146/79   Pulse 67   Ht 6' (1.829 m)   Wt 200 lb (90.7 kg)   BMI 27.12 kg/m   Constitutional:  Alert and oriented, No acute distress. HEENT: [place] AT, moist mucus membranes.   Trachea midline, no masses. Cardiovascular: No clubbing, cyanosis, or edema. Respiratory: Normal respiratory effort, no increased work of breathing. GU: Prostate 60 g, smooth without nodules Skin: No rashes, bruises or suspicious lesions. Neurologic: Grossly intact, no focal deficits, moving all 4 extremities. Psychiatric: Normal mood and affect.   Assessment & Plan:    - Elevated PSA Benign DRE.  PSA is elevated above baseline.  He requested that it be repeated today.  If it remains elevated above baseline would recommend prostate MRI.  No history of pacemaker or metallic implants.   [name], MD  [facility] [address], [address] [place], [place] [address] [phone]

## 2019-09-05 LAB — PSA: Prostate Specific Ag, Serum: 8.4 ng/mL — ABNORMAL HIGH (ref 0.0–4.0)

## 2019-09-06 ENCOUNTER — Telehealth: Payer: Self-pay | Admitting: Urology

## 2019-09-06 DIAGNOSIS — R972 Elevated prostate specific antigen [PSA]: Secondary | ICD-10-CM

## 2019-09-06 NOTE — Telephone Encounter (Signed)
Repeat PSA increased to 8.4.  Recommend scheduling prostate MRI.  Order was entered.  Will call with results.

## 2019-09-07 NOTE — Telephone Encounter (Signed)
Patient informed, voiced understanding.  °

## 2019-09-17 ENCOUNTER — Telehealth: Payer: Self-pay | Admitting: Urology

## 2019-09-17 MED ORDER — DIAZEPAM 5 MG PO TABS: ORAL_TABLET | ORAL | 0 refills | Status: DC

## 2019-09-17 NOTE — Telephone Encounter (Signed)
Called pt informed him of rx sent in and the need for a driver. Pt gave verbal understanding.

## 2019-09-17 NOTE — Telephone Encounter (Signed)
Rx sent.  He will need a driver

## 2019-09-17 NOTE — Telephone Encounter (Signed)
Pt called stating that he has an MRI scheduled next week on Jan 5 and is requesting something for claustrophobia to be sent to [facility] in [place]. (pt did not know street or address) Please advise. Thanks.

## 2019-09-17 NOTE — Telephone Encounter (Signed)
Please advise. Thanks.  

## 2019-09-22 ENCOUNTER — Other Ambulatory Visit: Payer: Self-pay

## 2019-09-22 ENCOUNTER — Ambulatory Visit
Admission: RE | Admit: 2019-09-22 | Discharge: 2019-09-22 | Disposition: A | Discharge status: Home / Self Care | Payer: BC Managed Care – PPO | Source: Ambulatory Visit | Attending: Urology | Admitting: Urology

## 2019-09-22 DIAGNOSIS — R972 Elevated prostate specific antigen [PSA]: Secondary | ICD-10-CM | POA: Insufficient documentation

## 2019-09-22 MED ORDER — GADOBUTROL 1 MMOL/ML IV SOLN
9.0000 mL | Freq: Once | INTRAVENOUS | Status: AC | PRN
  Administered 2019-09-22: 11:00:00 9 mL via INTRAVENOUS

## 2019-09-26 ENCOUNTER — Telehealth: Payer: Self-pay | Admitting: Urology

## 2019-09-26 DIAGNOSIS — R972 Elevated prostate specific antigen [PSA]: Secondary | ICD-10-CM

## 2019-09-26 NOTE — Telephone Encounter (Signed)
Prostate MRI showed no lesions suspicious for high-grade prostate cancer.  PSA density was 0.13.  Recommend follow-up PSA/DRE in 6 months.

## 2019-09-28 NOTE — Addendum Note (Signed)
Addended by: Frankey Shown on: 09/28/2019 04:04 PM   Modules accepted: Orders

## 2019-09-28 NOTE — Telephone Encounter (Signed)
Called pt informed him of the information below. Pt gave verbal understanding. Lab and Follow up appts scheduled. PSA ordered.

## 2019-11-12 ENCOUNTER — Encounter: Payer: Self-pay | Admitting: Emergency Medicine

## 2019-11-12 ENCOUNTER — Ambulatory Visit
Admission: EM | Admit: 2019-11-12 | Discharge: 2019-11-12 | Disposition: A | Discharge status: Home / Self Care | Payer: BC Managed Care – PPO | Attending: Urgent Care | Admitting: Urgent Care

## 2019-11-12 ENCOUNTER — Other Ambulatory Visit: Payer: Self-pay

## 2019-11-12 DIAGNOSIS — M109 Gout, unspecified: Secondary | ICD-10-CM

## 2019-11-12 MED ORDER — PREDNISONE 10 MG (21) PO TBPK: ORAL_TABLET | Freq: Every day | ORAL | 0 refills | Status: DC

## 2019-11-12 NOTE — Discharge Instructions (Signed)
It was very nice seeing you today in clinic. Thank you for entrusting me with your care.   Rest, ice, and elevate foot. Use steroids as prescribes. May take Tylenol as well to help with your pain.   Make arrangements to follow up with your regular doctor in 1 week for re-evaluation if not improving. If your symptoms/condition worsens, please seek follow up care either here or in the ER. Please remember, our [facility] providers are "right here with you" when you need [place].   Again, it was my pleasure to take care of you today. Thank you for choosing our clinic. I hope that you start to feel better quickly.   Quentin Mulling, MSN, APRN, FNP-C, CEN Advanced Practice Provider [facility]

## 2019-11-12 NOTE — ED Triage Notes (Signed)
Patient c/o possible gout in his right foot that started on Saturday. Patient reports he has had gout before.

## 2019-11-12 NOTE — ED Provider Notes (Signed)
[place], [place]   Name: Pedro Robinson DOB: [birth year] MRN: [id number] CSN: [id number] PCP: Jodi Marble, MD  Arrival date and time:  11/12/19 0855  Chief Complaint:  Gout   NOTE: Prior to seeing the patient today, I have reviewed the triage nursing documentation and vital signs. Clinical staff has updated patient's PMH/PSHx, current medication list, and drug allergies/intolerances to ensure comprehensive history available to assist in medical decision making.   History:   HPI: Pedro Robinson is a 74 y.o. male who presents today with complaints of pain to the dorsal aspect of his RIGHT foot and RIGHT great toe. Patient denies injury. Symptoms started on Saturday and have progressively worsened. Areas of concern are tender to palpation, erythematous, and warm. He is wearing bedroom shoes to clinic today citing that pain is preventing him from wearing his normal shoes. Pain is causing an altered gait pattern whereby patient is limping and having to [place] a cane for stability. PMH (+) for gouty arthritis and he notes that this is similar to previous flares. Patient denies any fevers or chills. He is unable to identify any triggering dietary causes; denies high purine diet. Patient does note that he has been eating more peanut butter as of late. In efforts to conservatively manage his symptoms at home, the patient notes that he has used ASA, which has not helped to improve his symptoms.   Past Medical History:  Diagnosis Date  . Gout   . Hyperlipemia   . Hypertension     Past Surgical History:  Procedure Laterality Date  . left foot      Family History  Problem Relation Age of Onset  . Prostate cancer Brother   . Kidney failure Mother   . Lung cancer Brother   . Bladder Cancer Neg Hx   . Kidney cancer Neg Hx     Social History   Tobacco Use  . Smoking status: Never Smoker  . Smokeless tobacco: Never Used  Substance Use Topics  . Alcohol use: No  . Drug use: No     Patient Active Problem List   Diagnosis Date Noted  . Elevated PSA 09/04/2019  . Benign prostatic hyperplasia without lower urinary tract symptoms 09/04/2019    Home Medications:    Current Meds  Medication Sig  . amLODipine-olmesartan (AZOR) 10-40 MG tablet Take 1 tablet by mouth daily.  . furosemide (LASIX) 40 MG tablet Take by mouth.  . hydrALAZINE (APRESOLINE) 50 MG tablet   . Multiple Vitamins-Minerals (MULTIVITAMIN WITH MINERALS) tablet Take 1 tablet by mouth daily.  . Omega-3 Fatty Acids (FISH OIL) 1000 MG CAPS Take by mouth.  . rosuvastatin (CRESTOR) 5 MG tablet     Allergies:   Ivp dye [iodinated diagnostic agents]  Review of Systems (ROS):  Review of systems NEGATIVE unless otherwise noted in narrative H&P section.   Vital Signs: Today's Vitals   11/12/19 0907 11/12/19 0910 11/12/19 0924  BP:  133/67   Pulse:  (!) 53   Resp:  18   Temp:  98.3 F (36.8 C)   TempSrc:  Oral   SpO2:  100%   Weight: 200 lb (90.7 kg)    Height: 6' (1.829 m)    PainSc: 7   7     Physical Exam: Physical Exam  Constitutional: He is oriented to person, place, and time and well-developed, well-nourished, and in no distress.  HENT:  Head: Normocephalic and atraumatic.  Eyes: Pupils are equal, round, and reactive to  light.  Cardiovascular: Intact distal pulses. Bradycardia present.  Pulmonary/Chest: Effort normal. No respiratory distress.  Musculoskeletal:     Right foot: Normal range of motion. Swelling and tenderness present. No crepitus.       Feet:     Comments: (+) PMS. Marked area TTP, erythematous, and warm to touch. Capillary refill WNL.   Neurological: He is alert and oriented to person, place, and time. Gait normal.  Skin: Skin is warm and dry. No rash noted. He is not diaphoretic.  Psychiatric: Mood, memory, affect and judgment normal.  Nursing note and vitals reviewed.   Urgent Care Treatments / Results:   No orders of the defined types were placed in this  encounter.   LABS: PLEASE NOTE: all labs that were ordered this encounter are listed, however only abnormal results are displayed. Labs Reviewed - No data to display  EKG: -None  RADIOLOGY: No results found.  PROCEDURES: Procedures  MEDICATIONS RECEIVED THIS VISIT: Medications - No data to display  PERTINENT CLINICAL COURSE NOTES/UPDATES:   Initial Impression / Assessment and Plan / Urgent Care Course:  Pertinent labs & imaging results that were available during my care of the patient were personally reviewed by me and considered in my medical decision making (see lab/imaging section of note for values and interpretations).  Pedro Robinson is a 74 y.o. male who presents to [facility] today with complaints of Gout  Patient is well appearing overall in clinic today. He does not appear to be in any acute distress. Presenting symptoms (see HPI) and exam as documented above. Exam consistent with flare of known gouty arthritis flare. Discussed increasing fluid intake in efforts to ensure adequate hydration and promote uric acid dilution/excretion. Reviewed low-purine diet. Will treat flare with a 10 systemic steroid course. Patient to supplement with APAP as needed. Encouraged rest, ice, and elevation as complimentary measures to help with his pain. Patient to continue to use cane for ambulation support.   Discussed follow up with primary care physician in 1 week for re-evaluation. I have reviewed the follow up and strict return precautions for any new or worsening symptoms. Patient is aware of symptoms that would be deemed urgent/emergent, and would thus require further evaluation either here or in the emergency department. At the time of discharge, he verbalized understanding and consent with the discharge plan as it was reviewed with him. All questions were fielded by provider and/or clinic staff prior to patient discharge.    Final Clinical Impressions / Urgent Care Diagnoses:     Final diagnoses:  Acute gout of right foot, unspecified cause    New Prescriptions:  [place] Controlled Substance Registry consulted? Not Applicable  Meds ordered this encounter  Medications  . predniSONE (STERAPRED UNI-PAK 21 TAB) 10 MG (21) TBPK tablet    Sig: Take by mouth daily. 5 tabs for 2 days, then 4 tabs for 2 days, then 3 tabs for 2 days, 2 tabs for 2 days, then 1 tab by mouth daily for 2 days    Dispense:  30 tablet    Refill:  0    Recommended Follow up Care:  Patient encouraged to follow up with the following provider within the specified time frame, or sooner as dictated by the severity of his symptoms. As always, he was instructed that for any urgent/emergent care needs, he should seek care either here or in the emergency department for more immediate evaluation.  Follow-up Information    Sherron Monday, MD In  1 week.   Specialty: Internal Medicine Why: General reassessment of symptoms if not improving Contact information: [address] [place] [address] [phone]         NOTE: This note was prepared using Dragon dictation software along with smaller phrase technology. Despite my best ability to proofread, there is the potential that transcriptional errors may still occur from this process, and are completely unintentional.    Verlee Monte, NP 11/12/19 [id number]

## 2020-02-22 ENCOUNTER — Other Ambulatory Visit: Payer: BC Managed Care – PPO

## 2020-02-22 ENCOUNTER — Other Ambulatory Visit: Payer: Self-pay

## 2020-02-22 DIAGNOSIS — R972 Elevated prostate specific antigen [PSA]: Secondary | ICD-10-CM

## 2020-02-23 LAB — PSA: Prostate Specific Ag, Serum: 6.7 ng/mL — ABNORMAL HIGH (ref 0.0–4.0)

## 2020-02-26 ENCOUNTER — Ambulatory Visit: Payer: Self-pay | Admitting: Urology

## 2020-02-26 ENCOUNTER — Encounter: Payer: Self-pay | Admitting: Urology

## 2020-02-26 ENCOUNTER — Ambulatory Visit: Payer: BC Managed Care – PPO | Admitting: Urology

## 2020-02-26 ENCOUNTER — Other Ambulatory Visit: Payer: Self-pay

## 2020-02-26 VITALS — BP 129/67 | HR 70 | Ht 72.0 in | Wt 200.0 lb

## 2020-02-26 DIAGNOSIS — N400 Enlarged prostate without lower urinary tract symptoms: Secondary | ICD-10-CM

## 2020-02-26 DIAGNOSIS — R972 Elevated prostate specific antigen [PSA]: Secondary | ICD-10-CM | POA: Diagnosis not present

## 2020-02-26 NOTE — Progress Notes (Signed)
02/26/2020 1:03 PM   Pedro Robinson March 05, 1946 [id number]  Referring provider: Jodi Marble, MD [place],  [place] [address]  Chief Complaint  Patient presents with   Elevated PSA    Urologic history: 1.Elevated PSA -Biopsy October 2018 PSA 5.2; 82 g prostate; benign pathology -Prostate MRI 09/2019 for PSA bump 8.2; 67 g gland; PI-RADS 1 lesions  HPI: 74 y.o. male presents for semiannual follow-up  -No voiding complaints -Denies dysuria, gross hematuria -Denies flank, abdominal or pelvic pain -PSA 02/22/2020 6.7     PMH: Past Medical History:  Diagnosis Date   Gout    Hyperlipemia    Hypertension     Surgical History: Past Surgical History:  Procedure Laterality Date   left foot      Home Medications:  Allergies as of 02/26/2020      Reactions   Ivp Dye [iodinated Diagnostic Agents] Nausea And Vomiting   Hot flashes       Medication List       Accurate as of February 26, 2020  1:03 PM. If you have any questions, ask your nurse or doctor.        amLODipine-olmesartan 10-40 MG tablet Commonly known as: AZOR Take 1 tablet by mouth daily.   diazepam 5 MG tablet Commonly known as: VALIUM 1 tab po 30 min prior to procedure   Fish Oil 1000 MG Caps Take by mouth.   furosemide 40 MG tablet Commonly known as: LASIX Take by mouth.   hydrALAZINE 50 MG tablet Commonly known as: APRESOLINE   isosorbide mononitrate 30 MG 24 hr tablet Commonly known as: IMDUR Take 30 mg by mouth daily.   multivitamin with minerals tablet Take 1 tablet by mouth daily.   predniSONE 10 MG (21) Tbpk tablet Commonly known as: STERAPRED UNI-PAK 21 TAB Take by mouth daily. 5 tabs for 2 days, then 4 tabs for 2 days, then 3 tabs for 2 days, 2 tabs for 2 days, then 1 tab by mouth daily for 2 days   rosuvastatin 5 MG tablet Commonly known as: CRESTOR       Allergies:  Allergies  Allergen Reactions   Ivp Dye [Iodinated Diagnostic Agents] Nausea  And Vomiting    Hot flashes     Family History: Family History  Problem Relation Age of Onset   Prostate cancer Brother    Kidney failure Mother    Lung cancer Brother    Bladder Cancer Neg Hx    Kidney cancer Neg Hx     Social History:  reports that he has never smoked. He has never used smokeless tobacco. He reports that he does not drink alcohol and does not use drugs.   Physical Exam: BP 129/67    Pulse 70    Ht 6' (1.829 m)    Wt 200 lb (90.7 kg)    BMI 27.12 kg/m   Constitutional:  Alert and oriented, No acute distress. HEENT: [place] AT, moist mucus membranes.  Trachea midline, no masses. Cardiovascular: No clubbing, cyanosis, or edema. Respiratory: Normal respiratory effort, no increased work of breathing. GU: Prostate 60 g, smooth without nodules Skin: No rashes, bruises or suspicious lesions. Neurologic: Grossly intact, no focal deficits, moving all 4 extremities. Psychiatric: Normal mood and affect.   Assessment & Plan:    1. Elevated PSA Most likely secondary to BPH/inflammation.  Recent prostate MRI negative for suspicious lesions.  He desires to continue surveillance and recommend a follow-up PSA/DRE 1 year  2.  BPH without LUTS   [name], MD  [facility] [address] [place], [place] [address] [phone]

## 2021-01-03 ENCOUNTER — Other Ambulatory Visit: Payer: Self-pay | Admitting: Family Medicine

## 2021-01-03 DIAGNOSIS — R972 Elevated prostate specific antigen [PSA]: Secondary | ICD-10-CM

## 2021-01-21 ENCOUNTER — Ambulatory Visit
Admission: EM | Admit: 2021-01-21 | Discharge: 2021-01-21 | Disposition: A | Discharge status: Home / Self Care | Payer: BC Managed Care – PPO | Attending: Emergency Medicine | Admitting: Emergency Medicine

## 2021-01-21 ENCOUNTER — Encounter: Payer: Self-pay | Admitting: Emergency Medicine

## 2021-01-21 ENCOUNTER — Other Ambulatory Visit: Payer: Self-pay

## 2021-01-21 DIAGNOSIS — K0889 Other specified disorders of teeth and supporting structures: Secondary | ICD-10-CM | POA: Diagnosis not present

## 2021-01-21 MED ORDER — HYDROCODONE-ACETAMINOPHEN 5-325 MG PO TABS: 1.0000 | ORAL_TABLET | Freq: Four times a day (QID) | ORAL | 0 refills | Status: DC | PRN

## 2021-01-21 NOTE — ED Triage Notes (Signed)
Patient c/o right bottom tooth pain that started 2-3 days ago.  Patient states that he is on an antibiotic and Tylenol for pain.  Patient states that the tylenol is not helping with his pain.  Patient states that he can not take ibuprofen.

## 2021-01-21 NOTE — ED Provider Notes (Signed)
[facility]    CSN: [id number] Arrival date & time: 01/21/21  1040      History   Chief Complaint Chief Complaint  Patient presents with  . Dental Pain    HPI Pedro Robinson is a 75 y.o. male.   HPI   75 year old male here for evaluation of dental pain.  Patient reports that 2 to 3 days ago he began having pain at the site of an old root canal in his right rear lower molar.  He saw his dentist who took x-rays and identified dental infection.  He was started on amoxicillin and told to take Tylenol for the pain.  He has been taking Tylenol but it has not been helping with his pain.  Patient has a history of kidney disease and cannot take NSAIDs.  Patient denies any fever or drainage from around the tooth.  Past Medical History:  Diagnosis Date  . Gout   . Hyperlipemia   . Hypertension     Patient Active Problem List   Diagnosis Date Noted  . Elevated PSA 09/04/2019  . Benign prostatic hyperplasia without lower urinary tract symptoms 09/04/2019    Past Surgical History:  Procedure Laterality Date  . left foot         Home Medications    Prior to Admission medications   Medication Sig Start Date End Date Taking? Authorizing Provider  amLODipine-olmesartan (AZOR) 10-40 MG tablet Take 1 tablet by mouth daily. 08/22/19  Yes [provider]  furosemide (LASIX) 40 MG tablet Take by mouth. 08/12/19 01/21/21 Yes [provider]  hydrALAZINE (APRESOLINE) 50 MG tablet  11/02/15  Yes [provider]  HYDROcodone-acetaminophen (NORCO) 5-325 MG tablet Take 1 tablet by mouth every 6 (six) hours as needed for moderate pain. 01/21/21  Yes Becky Augusta, NP  isosorbide mononitrate (IMDUR) 30 MG 24 hr tablet Take 30 mg by mouth daily. 12/24/19  Yes [provider]  Multiple Vitamins-Minerals (MULTIVITAMIN WITH MINERALS) tablet Take 1 tablet by mouth daily.   Yes [provider]  Omega-3 Fatty Acids (FISH OIL) 1000 MG CAPS Take by  mouth.   Yes [provider]  rosuvastatin (CRESTOR) 5 MG tablet  11/02/15  Yes [provider]  diazepam (VALIUM) 5 MG tablet 1 tab po 30 min prior to procedure 09/17/19   Stoioff, Verna Czech, MD  fluticasone (FLONASE) 50 MCG/ACT nasal spray  10/13/18 11/12/19  [provider]  fluticasone (VERAMYST) 27.5 MCG/SPRAY nasal spray Place 2 sprays into the nose daily.  11/12/19  [provider]    Family History Family History  Problem Relation Age of Onset  . Prostate cancer Brother   . Kidney failure Mother   . Lung cancer Brother   . Bladder Cancer Neg Hx   . Kidney cancer Neg Hx     Social History Social History   Tobacco Use  . Smoking status: Never Smoker  . Smokeless tobacco: Never Used  Vaping Use  . Vaping Use: Never used  Substance Use Topics  . Alcohol use: No  . Drug use: No     Allergies   Ivp dye [iodinated diagnostic agents]   Review of Systems Review of Systems  Constitutional: Negative for activity change, appetite change and fever.  HENT: Positive for dental problem.   Hematological: Negative.   Psychiatric/Behavioral: Negative.      Physical Exam Triage Vital Signs ED Triage Vitals  Enc Vitals Group     BP 01/21/21 1101 (!) 142/71  Pulse Rate 01/21/21 1101 64     Resp 01/21/21 1101 16     Temp 01/21/21 1101 98.9 F (37.2 C)     Temp Source 01/21/21 1101 Oral     SpO2 01/21/21 1101 97 %     Weight 01/21/21 1057 200 lb (90.7 kg)     Height 01/21/21 1057 6' (1.829 m)     Head Circumference --      Peak Flow --      Pain Score 01/21/21 1057 10     Pain Loc --      Pain Edu? --      Excl. in GC? --    No data found.  Updated Vital Signs BP (!) 142/71 (BP Location: Left Arm)   Pulse 64   Temp 98.9 F (37.2 C) (Oral)   Resp 16   Ht 6' (1.829 m)   Wt 200 lb (90.7 kg)   SpO2 97%   BMI 27.12 kg/m   Visual Acuity Right Eye Distance:   Left Eye Distance:   Bilateral Distance:    Right Eye Near:    Left Eye Near:    Bilateral Near:     Physical Exam Vitals and nursing note reviewed.  Constitutional:      General: He is not in acute distress.    Appearance: Normal appearance. He is not ill-appearing.  HENT:     Head: Normocephalic and atraumatic.     Mouth/Throat:     Mouth: Mucous membranes are moist.     Pharynx: Oropharynx is clear. No oropharyngeal exudate or posterior oropharyngeal erythema.  Musculoskeletal:        General: No swelling.  Skin:    General: Skin is warm and dry.     Capillary Refill: Capillary refill takes less than 2 seconds.     Findings: No erythema.  Neurological:     General: No focal deficit present.     Mental Status: He is alert and oriented to person, place, and time.  Psychiatric:        Mood and Affect: Mood normal.        Behavior: Behavior normal.        Thought Content: Thought content normal.        Judgment: Judgment normal.      UC Treatments / Results  Labs (all labs ordered are listed, but only abnormal results are displayed) Labs Reviewed - No data to display  EKG   Radiology No results found.  Procedures Procedures (including critical care time)  Medications Ordered in UC Medications - No data to display  Initial Impression / Assessment and Plan / UC Course  I have reviewed the triage vital signs and the nursing notes.  Pertinent labs & imaging results that were available during my care of the patient were reviewed by me and considered in my medical decision making (see chart for details).   Patient is a very pleasant 75 year old male here for evaluation of dental pain in his right lower jaw.  Patient reports that the pain radiates up into his TMJ.  He says the pain is stemming from an infection at the site of old root canal in his right lower rear molar.  He denies any fever or drainage.  Physical exam reveals the presence of a crown over the site of the old root canal.  It is tender to percussion.  There is no  fullness or fluctuance to the floor of the mouth or the submental area.  There is no overlying erythema or edema.  Patient is already on amoxicillin for treatment of infection.  He states he cannot take NSAIDs due to CKD.  Will cover patient with Norco and have advised patient to ensure that he does not take any more than 4000 mg of Tylenol daily.  Patient to follow-up with oral surgery next week as previously planned.   Final Clinical Impressions(s) / UC Diagnoses   Final diagnoses:  Pain, dental     Discharge Instructions     Continue use of Tylenol as needed for mild to moderate pain and use the Norco as needed for severe pain.  Norco also contains Tylenol so keep track of the amount you are taking and do not exceed 4000 mg of Tylenol daily.  If you develop any drainage from around your tooth, swelling to the floor of her mouth, swelling under your jaw, or difficulty swallowing go to the emergency department at [facility] to be evaluated by the on-call dental team.    ED Prescriptions    Medication Sig Dispense Auth. Provider   HYDROcodone-acetaminophen (NORCO) 5-325 MG tablet Take 1 tablet by mouth every 6 (six) hours as needed for moderate pain. 20 tablet Becky Augusta, NP     I have reviewed the PDMP during this encounter.   Becky Augusta, NP 01/21/21 1146

## 2021-01-21 NOTE — Discharge Instructions (Addendum)
Continue use of Tylenol as needed for mild to moderate pain and use the Norco as needed for severe pain.  Norco also contains Tylenol so keep track of the amount you are taking and do not exceed 4000 mg of Tylenol daily.  If you develop any drainage from around your tooth, swelling to the floor of her mouth, swelling under your jaw, or difficulty swallowing go to the emergency department at [facility] to be evaluated by the on-call dental team.

## 2021-02-21 ENCOUNTER — Other Ambulatory Visit: Payer: BC Managed Care – PPO

## 2021-02-21 ENCOUNTER — Encounter: Payer: Self-pay | Admitting: Ophthalmology

## 2021-02-21 ENCOUNTER — Other Ambulatory Visit: Payer: Self-pay

## 2021-02-21 DIAGNOSIS — R972 Elevated prostate specific antigen [PSA]: Secondary | ICD-10-CM

## 2021-02-22 LAB — PSA: Prostate Specific Ag, Serum: 6.4 ng/mL — ABNORMAL HIGH (ref 0.0–4.0)

## 2021-02-24 ENCOUNTER — Other Ambulatory Visit: Payer: Self-pay

## 2021-02-24 ENCOUNTER — Encounter: Payer: Self-pay | Admitting: Urology

## 2021-02-24 ENCOUNTER — Ambulatory Visit (INDEPENDENT_AMBULATORY_CARE_PROVIDER_SITE_OTHER): Payer: BC Managed Care – PPO | Admitting: Urology

## 2021-02-24 VITALS — BP 136/66 | HR 61 | Ht 72.0 in | Wt 200.0 lb

## 2021-02-24 DIAGNOSIS — N400 Enlarged prostate without lower urinary tract symptoms: Secondary | ICD-10-CM | POA: Diagnosis not present

## 2021-02-24 DIAGNOSIS — R972 Elevated prostate specific antigen [PSA]: Secondary | ICD-10-CM

## 2021-02-24 NOTE — Progress Notes (Signed)
   02/24/2021 11:27 AM   Pedro Robinson July 16, 1946 [id number]  Referring provider: Sherron Monday, MD [address] [place],  [place] [address]  Chief Complaint  Patient presents with   Elevated PSA    Urologic history: 1.  Elevated PSA -Biopsy October 2018 PSA 5.2; 82 g prostate; benign pathology -Prostate MRI 09/2019 for PSA bump 8.2; 67 g gland; PI-RADS 1 lesions   HPI: 75 y.o. male presents for annual follow-up.  Doing well without problems since last years visit Denies dysuria, gross hematuria Denies flank, abdominal or pelvic pain PSA 02/21/2021 stable at 6.4     PMH: Past Medical History:  Diagnosis Date   Chronic kidney disease    Gout    Hyperlipemia    Hypertension     Surgical History: Past Surgical History:  Procedure Laterality Date   left foot      Home Medications:  Allergies as of 02/24/2021       Reactions   Ivp Dye [iodinated Diagnostic Agents] Nausea And Vomiting   Hot flashes         Medication List        Accurate as of February 24, 2021 11:27 AM. If you have any questions, ask your nurse or doctor.          amLODipine-olmesartan 10-40 MG tablet Commonly known as: AZOR Take 1 tablet by mouth daily.   diazepam 5 MG tablet Commonly known as: VALIUM 1 tab po 30 min prior to procedure   Fish Oil 1000 MG Caps Take by mouth.   furosemide 40 MG tablet Commonly known as: LASIX Take by mouth.   hydrALAZINE 50 MG tablet Commonly known as: APRESOLINE   HYDROcodone-acetaminophen 5-325 MG tablet Commonly known as: Norco Take 1 tablet by mouth every 6 (six) hours as needed for moderate pain.   isosorbide mononitrate 30 MG 24 hr tablet Commonly known as: IMDUR Take 30 mg by mouth daily.   multivitamin with minerals tablet Take 1 tablet by mouth daily.   rosuvastatin 5 MG tablet Commonly known as: CRESTOR        Allergies:  Allergies  Allergen Reactions   Ivp Dye [Iodinated Diagnostic Agents] Nausea And Vomiting     Hot flashes     Family History: Family History  Problem Relation Age of Onset   Prostate cancer Brother    Kidney failure Mother    Lung cancer Brother    Bladder Cancer Neg Hx    Kidney cancer Neg Hx     Social History:  reports that he has never smoked. He has never used smokeless tobacco. He reports current alcohol use. He reports that he does not use drugs.   Physical Exam: There were no vitals taken for this visit.  Constitutional:  Alert and oriented, No acute distress. HEENT: [place] AT, moist mucus membranes.  Trachea midline, no masses. Cardiovascular: No clubbing, cyanosis, or edema. Respiratory: Normal respiratory effort, no increased work of breathing. GU: Prostate 60 g, smooth without nodules Lymph: No cervical or inguinal lymphadenopathy. Skin: No rashes, bruises or suspicious lesions. Neurologic: Grossly intact, no focal deficits, moving all 4 extremities. Psychiatric: Normal mood and affect.   Assessment & Plan:    1.  Elevated PSA Stable Benign DRE Continue annual follow-up  2.  BPH without LUTS   [name], MD  [facility] [address] [place], [place] [address] [phone]

## 2021-02-25 ENCOUNTER — Encounter: Payer: Self-pay | Admitting: Urology

## 2021-03-06 ENCOUNTER — Ambulatory Visit
Admission: RE | Admit: 2021-03-06 | Discharge: 2021-03-06 | Disposition: A | Discharge status: Home / Self Care | Payer: BC Managed Care – PPO | Attending: Ophthalmology | Admitting: Ophthalmology

## 2021-03-06 ENCOUNTER — Encounter
Admission: RE | Disposition: A | Discharge status: Home / Self Care | Payer: Self-pay | Source: Home / Self Care | Attending: Ophthalmology

## 2021-03-06 ENCOUNTER — Ambulatory Visit: Payer: BC Managed Care – PPO | Admitting: Anesthesiology

## 2021-03-06 ENCOUNTER — Encounter: Payer: Self-pay | Admitting: Ophthalmology

## 2021-03-06 ENCOUNTER — Other Ambulatory Visit: Payer: Self-pay

## 2021-03-06 DIAGNOSIS — Z79899 Other long term (current) drug therapy: Secondary | ICD-10-CM | POA: Insufficient documentation

## 2021-03-06 DIAGNOSIS — H2511 Age-related nuclear cataract, right eye: Secondary | ICD-10-CM | POA: Insufficient documentation

## 2021-03-06 HISTORY — PX: CATARACT EXTRACTION W/PHACO: SHX586

## 2021-03-06 HISTORY — DX: Chronic kidney disease, unspecified: N18.9

## 2021-03-06 SURGERY — PHACOEMULSIFICATION, CATARACT, WITH IOL INSERTION
Anesthesia: Monitor Anesthesia Care | Site: Eye | Laterality: Right

## 2021-03-06 MED ORDER — CYCLOPENTOLATE HCL 2 % OP SOLN
1.0000 [drp] | OPHTHALMIC | Status: AC
  Administered 2021-03-06 (×3): 1 [drp] via OPHTHALMIC

## 2021-03-06 MED ORDER — EPINEPHRINE PF 1 MG/ML IJ SOLN
INTRAOCULAR | Status: DC | PRN
  Administered 2021-03-06: 70 mL via OPHTHALMIC

## 2021-03-06 MED ORDER — FENTANYL CITRATE (PF) 100 MCG/2ML IJ SOLN
INTRAMUSCULAR | Status: DC | PRN
  Administered 2021-03-06: 50 ug via INTRAVENOUS

## 2021-03-06 MED ORDER — LACTATED RINGERS IV SOLN: INTRAVENOUS | Status: DC

## 2021-03-06 MED ORDER — SODIUM HYALURONATE 23MG/ML IO SOSY
PREFILLED_SYRINGE | INTRAOCULAR | Status: DC | PRN
  Administered 2021-03-06: 0.6 mL via INTRAOCULAR

## 2021-03-06 MED ORDER — PHENYLEPHRINE HCL 10 % OP SOLN
1.0000 [drp] | OPHTHALMIC | Status: AC | PRN
  Administered 2021-03-06 (×3): 1 [drp] via OPHTHALMIC

## 2021-03-06 MED ORDER — LIDOCAINE HCL (PF) 2 % IJ SOLN
INTRAOCULAR | Status: DC | PRN
  Administered 2021-03-06: 1 mL via INTRAOCULAR

## 2021-03-06 MED ORDER — TETRACAINE HCL 0.5 % OP SOLN
1.0000 [drp] | OPHTHALMIC | Status: DC | PRN
  Administered 2021-03-06 (×3): 1 [drp] via OPHTHALMIC

## 2021-03-06 MED ORDER — MIDAZOLAM HCL 2 MG/2ML IJ SOLN
INTRAMUSCULAR | Status: DC | PRN
  Administered 2021-03-06: 1 mg via INTRAVENOUS

## 2021-03-06 MED ORDER — SODIUM HYALURONATE 10 MG/ML IO SOLUTION
PREFILLED_SYRINGE | INTRAOCULAR | Status: DC | PRN
  Administered 2021-03-06: 0.55 mL via INTRAOCULAR

## 2021-03-06 MED ORDER — MOXIFLOXACIN HCL 0.5 % OP SOLN
OPHTHALMIC | Status: DC | PRN
  Administered 2021-03-06: 0.2 mL via OPHTHALMIC

## 2021-03-06 SURGICAL SUPPLY — 15 items
CANNULA ANT/CHMB 27GA (MISCELLANEOUS) ×6 IMPLANT
DISSECTOR HYDRO NUCLEUS 50X22 (MISCELLANEOUS) ×3 IMPLANT
GLOVE SURG SYN 8.5  E (GLOVE) ×2
GLOVE SURG SYN 8.5 E (GLOVE) ×1 IMPLANT
GOWN STRL REUS W/ TWL LRG LVL3 (GOWN DISPOSABLE) ×2 IMPLANT
GOWN STRL REUS W/TWL LRG LVL3 (GOWN DISPOSABLE) ×6
LENS IOL TECNIS EYHANCE 19.5 (Intraocular Lens) ×3 IMPLANT
MARKER SKIN DUAL TIP RULER LAB (MISCELLANEOUS) ×3 IMPLANT
PACK DR. KING ARMS (PACKS) IMPLANT
PACK EYE AFTER SURG (MISCELLANEOUS) ×3 IMPLANT
PACK OPTHALMIC (MISCELLANEOUS) IMPLANT
SYR 3ML LL SCALE MARK (SYRINGE) ×3 IMPLANT
SYR TB 1ML LUER SLIP (SYRINGE) ×3 IMPLANT
WATER STERILE IRR 250ML POUR (IV SOLUTION) ×3 IMPLANT
WIPE NON LINTING 3.25X3.25 (MISCELLANEOUS) ×3 IMPLANT

## 2021-03-06 NOTE — H&P (Signed)
[facility]   Primary Care Physician:  Sherron Monday, MD Ophthalmologist: Dr. Willey Blade  Pre-Procedure History & Physical: HPI:  Pedro Robinson is a 75 y.o. male here for cataract surgery.   Past Medical History:  Diagnosis Date   Chronic kidney disease    Gout    Hyperlipemia    Hypertension     Past Surgical History:  Procedure Laterality Date   left foot      Prior to Admission medications   Medication Sig Start Date End Date Taking? Authorizing Provider  amLODipine-olmesartan (AZOR) 10-40 MG tablet Take 1 tablet by mouth daily. 08/22/19  Yes [provider]  B Complex-C-Folic Acid (HM SUPER VITAMIN B COMPLEX/C PO) Take by mouth daily.   Yes [provider]  cetirizine (ZYRTEC) 10 MG tablet Take 10 mg by mouth daily.   Yes [provider]  Cholecalciferol (VITAMIN D3) 1.25 MG (50000 UT) TABS Take 2,000 Units by mouth.   Yes [provider]  furosemide (LASIX) 40 MG tablet Take by mouth. 08/12/19 03/06/21 Yes [provider]  hydrALAZINE (APRESOLINE) 50 MG tablet  11/02/15  Yes [provider]  isosorbide mononitrate (IMDUR) 30 MG 24 hr tablet Take 30 mg by mouth daily. 12/24/19  Yes [provider]  latanoprost (XALATAN) 0.005 % ophthalmic solution 1 drop at bedtime.   Yes [provider]  Multiple Vitamins-Minerals (MULTIVITAMIN WITH MINERALS) tablet Take 1 tablet by mouth daily.   Yes [provider]  Omega-3 Fatty Acids (FISH OIL) 1000 MG CAPS Take by mouth.   Yes [provider]  rosuvastatin (CRESTOR) 5 MG tablet  11/02/15  Yes [provider]  sennosides-docusate sodium (SENOKOT-S) 8.6-50 MG tablet Take 1 tablet by mouth daily.   Yes [provider]  diazepam (VALIUM) 5 MG tablet 1 tab po 30 min prior to procedure 09/17/19   Stoioff, Verna Czech, MD  HYDROcodone-acetaminophen (NORCO) 5-325 MG tablet Take 1 tablet by mouth every 6 (six) hours as needed for  moderate pain. Patient not taking: Reported on 02/21/2021 01/21/21   Becky Augusta, NP  fluticasone Aleda Grana) 50 MCG/ACT nasal spray  10/13/18 11/12/19  [provider]  fluticasone (VERAMYST) 27.5 MCG/SPRAY nasal spray Place 2 sprays into the nose daily.  11/12/19  [provider]    Allergies as of 01/24/2021 - Review Complete 01/21/2021  Allergen Reaction Noted   Ivp dye [iodinated diagnostic agents] Nausea And Vomiting 10/23/2013    Family History  Problem Relation Age of Onset   Prostate cancer Brother    Kidney failure Mother    Lung cancer Brother    Bladder Cancer Neg Hx    Kidney cancer Neg Hx     Social History   Socioeconomic History   Marital status: Married    Spouse name: Not on file   Number of children: Not on file   Years of education: Not on file   Highest education level: Not on file  Occupational History   Not on file  Tobacco Use   Smoking status: Never   Smokeless tobacco: Never  Vaping Use   Vaping Use: Never used  Substance and Sexual Activity   Alcohol use: Yes    Comment: occasionally   Drug use: No   Sexual activity: Yes    Birth control/protection: None  Other Topics Concern   Not on file  Social History Narrative   Not on file   Social Determinants of Health   Financial Resource Strain:  Not on file  Food Insecurity: Not on file  Transportation Needs: Not on file  Physical Activity: Not on file  Stress: Not on file  Social Connections: Not on file  Intimate Partner Violence: Not on file    Review of Systems: See HPI, otherwise negative ROS  Physical Exam: BP (!) 147/68   Pulse (!) 50   Temp 97.8 F (36.6 C) (Temporal)   Resp 16   Ht 6' (1.829 m)   Wt 88.5 kg   SpO2 99%   BMI 26.45 kg/m  General:   Alert,  pleasant and cooperative in NAD Head:  Normocephalic and atraumatic. Respiratory:  Normal work of breathing. Cardiovascular:  RRR  Impression/Plan: Pedro Robinson is here for cataract  surgery.  Risks, benefits, limitations, and alternatives regarding cataract surgery have been reviewed with the patient.  Questions have been answered.  All parties agreeable.   Willey Blade, MD  03/06/2021, 9:14 AM

## 2021-03-06 NOTE — Transfer of Care (Signed)
Immediate Anesthesia Transfer of Care Note  Patient: Pedro Robinson  Procedure(s) Performed: CATARACT EXTRACTION PHACO AND INTRAOCULAR LENS PLACEMENT (IOC) RIGHT 3.63 00:31.8 (Right: Eye)  Patient Location: PACU  Anesthesia Type: MAC  Level of Consciousness: awake, alert  and patient cooperative  Airway and Oxygen Therapy: Patient Spontanous Breathing and Patient connected to supplemental oxygen  Post-op Assessment: Post-op Vital signs reviewed, Patient's Cardiovascular Status Stable, Respiratory Function Stable, Patent Airway and No signs of Nausea or vomiting  Post-op Vital Signs: Reviewed and stable  Complications: No notable events documented.

## 2021-03-06 NOTE — Anesthesia Procedure Notes (Signed)
Procedure Name: MAC Date/Time: 03/06/2021 9:18 AM Performed by: Cameron Ali, CRNA Pre-anesthesia Checklist: Patient identified, Emergency Drugs available, Suction available, Timeout performed and Patient being monitored Patient Re-evaluated:Patient Re-evaluated prior to induction Oxygen Delivery Method: Nasal cannula Placement Confirmation: positive ETCO2

## 2021-03-06 NOTE — Anesthesia Postprocedure Evaluation (Signed)
Anesthesia Post Note  Patient: Pedro Robinson  Procedure(s) Performed: CATARACT EXTRACTION PHACO AND INTRAOCULAR LENS PLACEMENT (IOC) RIGHT 3.63 00:31.8 (Right: Eye)     Patient location during evaluation: PACU Anesthesia Type: MAC Level of consciousness: awake and alert Pain management: pain level controlled Vital Signs Assessment: post-procedure vital signs reviewed and stable Respiratory status: spontaneous breathing and nonlabored ventilation Cardiovascular status: blood pressure returned to baseline Postop Assessment: no apparent nausea or vomiting Anesthetic complications: no   No notable events documented.  Jameeka Marcy [employer]

## 2021-03-06 NOTE — Anesthesia Preprocedure Evaluation (Signed)
Anesthesia Evaluation  Patient identified by MRN, date of birth, ID band Patient awake    Reviewed: Allergy & Precautions, NPO status , Patient's Chart, lab work & pertinent test results  Airway Mallampati: II  TM Distance: >3 FB Neck ROM: Full    Dental no notable dental hx.    Pulmonary neg pulmonary ROS,    Pulmonary exam normal        Cardiovascular hypertension, Normal cardiovascular exam     Neuro/Psych negative neurological ROS  negative psych ROS   GI/Hepatic negative GI ROS, Neg liver ROS,   Endo/Other    Renal/GU CRFRenal disease     Musculoskeletal  (+) Arthritis , Osteoarthritis,    Abdominal Normal abdominal exam  (+)   Peds  Hematology negative hematology ROS (+)   Anesthesia Other Findings   Reproductive/Obstetrics                             Anesthesia Physical Anesthesia Plan  ASA: 2  Anesthesia Plan: MAC   Post-op Pain Management:    Induction: Intravenous  PONV Risk Score and Plan: 1 and TIVA, Midazolam and Treatment may vary due to age or medical condition  Airway Management Planned: Nasal Cannula and Natural Airway  Additional Equipment:   Intra-op Plan:   Post-operative Plan:   Informed Consent: I have reviewed the patients History and Physical, chart, labs and discussed the procedure including the risks, benefits and alternatives for the proposed anesthesia with the patient or authorized representative who has indicated his/her understanding and acceptance.     Dental advisory given  Plan Discussed with: CRNA  Anesthesia Plan Comments:         Anesthesia Quick Evaluation

## 2021-03-06 NOTE — Op Note (Signed)
OPERATIVE NOTE  LENNIX KNEISEL [id number] 03/06/2021   PREOPERATIVE DIAGNOSIS:  Nuclear sclerotic cataract right eye.  H25.11   POSTOPERATIVE DIAGNOSIS:    Nuclear sclerotic cataract right eye.     PROCEDURE:  Phacoemusification with posterior chamber intraocular lens placement of the right eye   LENS:   Implant Name Type Inv. Item Serial No. Manufacturer Lot No. LRB No. Used Action  LENS IOL TECNIS EYHANCE 19.5 - [id number] Intraocular Lens LENS IOL TECNIS EYHANCE 19.5 [id number] [id number]   Right 1 Implanted       Procedure(s): CATARACT EXTRACTION PHACO AND INTRAOCULAR LENS PLACEMENT (IOC) RIGHT 3.63 00:31.8 (Right)  DIB00 +19.5   SURGEON:  Willey Blade, MD, MPH  ANESTHESIOLOGIST: Anesthesiologist: Fletcher Anon, MD CRNA: Maree Krabbe, CRNA   ANESTHESIA:  Topical with tetracaine drops augmented with 1% preservative-free intracameral lidocaine.  ESTIMATED BLOOD LOSS: less than 1 mL.   COMPLICATIONS:  None.   DESCRIPTION OF PROCEDURE:  The patient was identified in the holding room and transported to the operating room and placed in the supine position under the operating microscope.  The right eye was identified as the operative eye and it was prepped and draped in the usual sterile ophthalmic fashion.   A 1.0 millimeter clear-corneal paracentesis was made at the 10:30 position. 0.5 ml of preservative-free 1% lidocaine with epinephrine was injected into the anterior chamber.  The anterior chamber was filled with Healon 5 viscoelastic.  A 2.4 millimeter keratome was used to make a near-clear corneal incision at the 8:00 position.  A curvilinear capsulorrhexis was made with a cystotome and capsulorrhexis forceps.  Balanced salt solution was used to hydrodissect and hydrodelineate the nucleus.   Phacoemulsification was then used in stop and chop fashion to remove the lens nucleus and epinucleus.  The remaining cortex was then removed using the irrigation and aspiration handpiece.  Healon was then placed into the capsular bag to distend it for lens placement.  A lens was then injected into the capsular bag.  The remaining viscoelastic was aspirated.   Wounds were hydrated with balanced salt solution.  The anterior chamber was inflated to a physiologic pressure with balanced salt solution.   Intracameral vigamox 0.1 mL undiluted was injected into the eye and a drop placed onto the ocular surface.  No wound leaks were noted.  The patient was taken to the recovery room in stable condition without complications of anesthesia or surgery  Willey Blade 03/06/2021, 9:37 AM

## 2021-03-07 ENCOUNTER — Encounter: Payer: Self-pay | Admitting: Ophthalmology

## 2021-03-07 ENCOUNTER — Other Ambulatory Visit: Payer: Self-pay

## 2021-03-15 ENCOUNTER — Encounter: Payer: Self-pay | Admitting: Ophthalmology

## 2021-03-21 ENCOUNTER — Encounter: Payer: Self-pay | Admitting: Ophthalmology

## 2021-03-27 ENCOUNTER — Other Ambulatory Visit: Payer: Self-pay

## 2021-03-27 ENCOUNTER — Ambulatory Visit: Payer: BC Managed Care – PPO | Admitting: Anesthesiology

## 2021-03-27 ENCOUNTER — Ambulatory Visit
Admission: RE | Admit: 2021-03-27 | Discharge: 2021-03-27 | Disposition: A | Discharge status: Home / Self Care | Payer: BC Managed Care – PPO | Attending: Ophthalmology | Admitting: Ophthalmology

## 2021-03-27 ENCOUNTER — Encounter: Payer: Self-pay | Admitting: Ophthalmology

## 2021-03-27 ENCOUNTER — Encounter
Admission: RE | Disposition: A | Discharge status: Home / Self Care | Payer: Self-pay | Source: Home / Self Care | Attending: Ophthalmology

## 2021-03-27 DIAGNOSIS — Z79899 Other long term (current) drug therapy: Secondary | ICD-10-CM | POA: Insufficient documentation

## 2021-03-27 DIAGNOSIS — Z91041 Radiographic dye allergy status: Secondary | ICD-10-CM | POA: Diagnosis not present

## 2021-03-27 DIAGNOSIS — H2512 Age-related nuclear cataract, left eye: Secondary | ICD-10-CM | POA: Diagnosis present

## 2021-03-27 HISTORY — PX: CATARACT EXTRACTION W/PHACO: SHX586

## 2021-03-27 IMAGING — MR MR PROSTATE WO/W CM
56 series · 56 of 56 positions shown · IV contrast (9ml Gadavist)
Comparison: Present

CLINICAL DATA: Elevated PSA equal 8.4.  Biopsy 07/02/2017.

EXAM:
MR PROSTATE WITHOUT AND WITH CONTRAST
TECHNIQUE: Multiplanar multisequence MRI images were obtained of the pelvis
centered about the prostate. Pre and post contrast images were
obtained.
CONTRAST:  9mL GADAVIST GADOBUTROL 1 MMOL/ML IV SOLN

[Series 3: ax in&(date) · axial · 6.0mm · 0.74mm/px · 1 of 30 slices shown (1 of 2)]
[im 1/30]
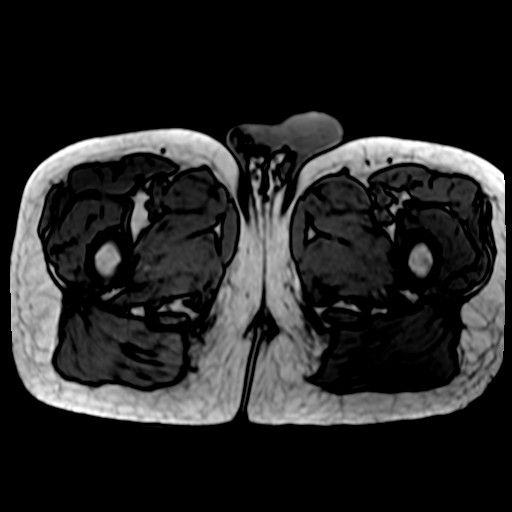

[Series 3: ax in&(date) · axial · 6.0mm · 0.74mm/px · 1 of 30 slices shown (2 of 2)]
[im 1/30]
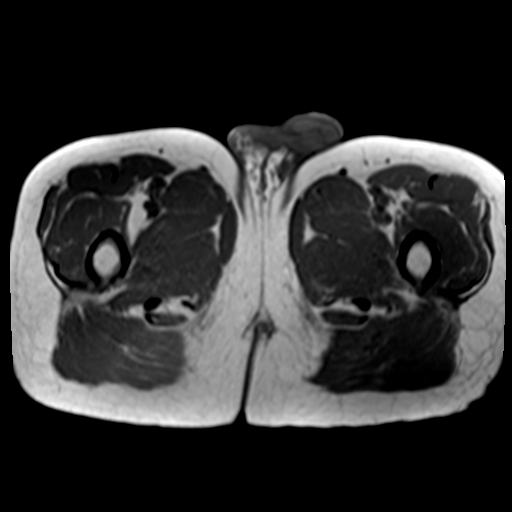

[Series 4: bSSFP fat-sat · axial · 8.0mm · 0.74mm/px · 1 of 25 slices shown]
[im 1/25]
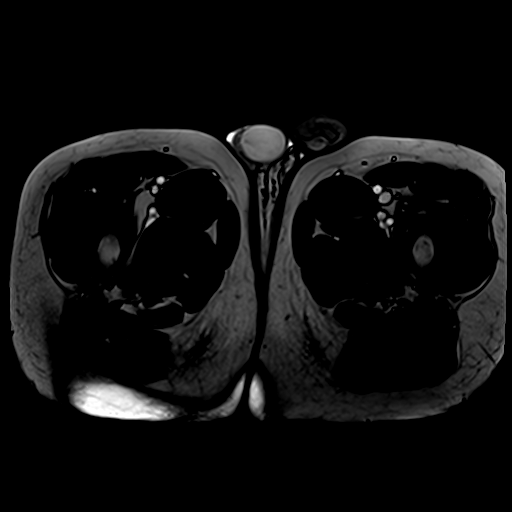

[Series 5: T2 · coronal · 3.0mm · 0.70mm/px · 1 of 30 slices shown (1 of 3)]
[im 1/30]
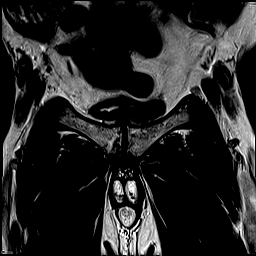

[Series 6: T2 · sagittal · 3.5mm · 0.31mm/px · 1 of 27 slices shown (2 of 3)]
[im 1/27]
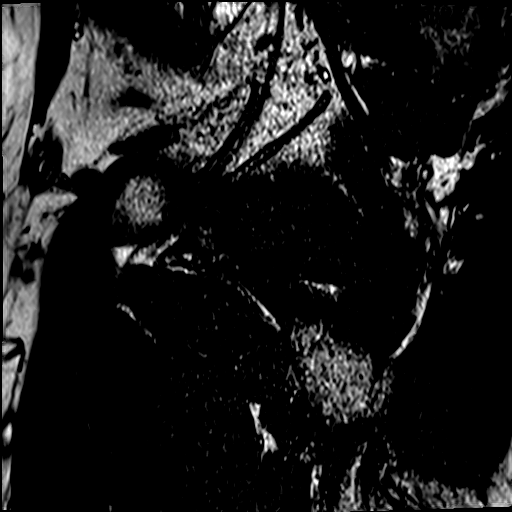

[Series 7: T2 · axial · 3.5mm · 0.56mm/px · 1 of 27 slices shown (3 of 3)]
[im 1/27]
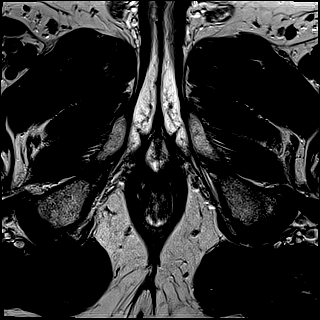

[Series 8: t2_space_tra (id) · axial · 1.0mm · 1.04mm/px · 1 of 96 slices shown]
[im 1/96]
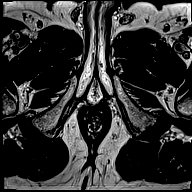

[Series 9: DWI · axial · 3.0mm · 0.86mm/px · 1 of 89 slices shown (1 of 3)]
[im 1/89]
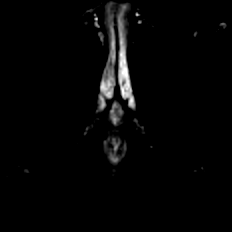

[Series 10: DWI · axial · 3.0mm · 0.86mm/px · 1 of 30 slices shown (2 of 3)]
[im 1/30]
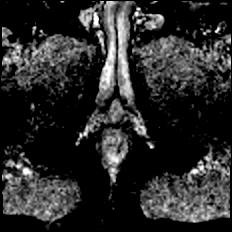

[Series 11: DWI · axial · 3.0mm · 0.86mm/px · 1 of 30 slices shown (3 of 3)]
[im 1/30]
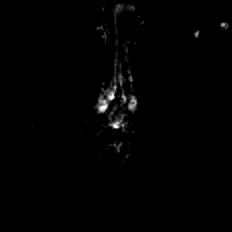

[Series 12: T1 · axial · 3.0mm · 1.15mm/px · 1 of 30 slices shown (1 of 46)]
[im 1/30]
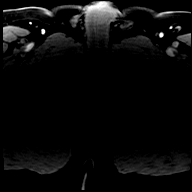

[Series 13: T1 · axial · 3.0mm · 1.15mm/px · 1 of 30 slices shown (2 of 46)]
[im 1/30]
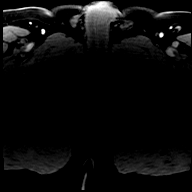

[Series 14: T1 · axial · 3.0mm · 1.15mm/px · 1 of 30 slices shown (3 of 46)]
[im 1/30]
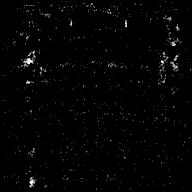

[Series 15: T1 · axial · 3.0mm · 1.15mm/px · 1 of 30 slices shown (4 of 46)]
[im 1/30]
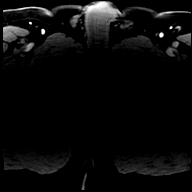

[Series 16: T1 · axial · 3.0mm · 1.15mm/px · 1 of 30 slices shown (5 of 46)]
[im 1/30]
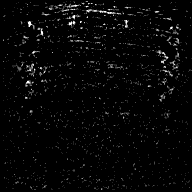

[Series 17: T1 · axial · 3.0mm · 1.15mm/px · 1 of 30 slices shown (6 of 46)]
[im 1/30]
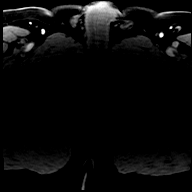

[Series 18: T1 · axial · 3.0mm · 1.15mm/px · 1 of 30 slices shown (7 of 46)]
[im 1/30]
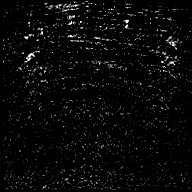

[Series 19: T1 · axial · 3.0mm · 1.15mm/px · 1 of 30 slices shown (8 of 46)]
[im 1/30]
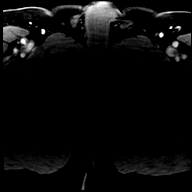

[Series 20: T1 · axial · 3.0mm · 1.15mm/px · 1 of 30 slices shown (9 of 46)]
[im 1/30]
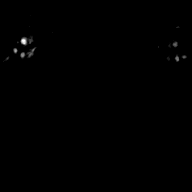

[Series 21: T1 · axial · 3.0mm · 1.15mm/px · 1 of 30 slices shown (10 of 46)]
[im 1/30]
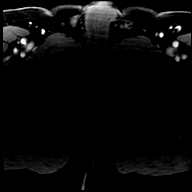

[Series 22: T1 · axial · 3.0mm · 1.15mm/px · 1 of 30 slices shown (11 of 46)]
[im 1/30]
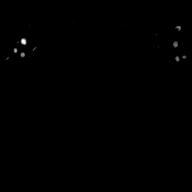

[Series 23: T1 · axial · 3.0mm · 1.15mm/px · 1 of 30 slices shown (12 of 46)]
[im 1/30]
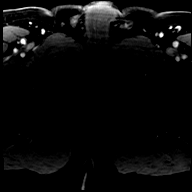

[Series 24: T1 · axial · 3.0mm · 1.15mm/px · 1 of 30 slices shown (13 of 46)]
[im 1/30]
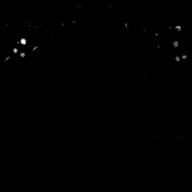

[Series 25: T1 · axial · 3.0mm · 1.15mm/px · 1 of 30 slices shown (14 of 46)]
[im 1/30]
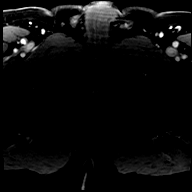

[Series 26: T1 · axial · 3.0mm · 1.15mm/px · 1 of 30 slices shown (15 of 46)]
[im 1/30]
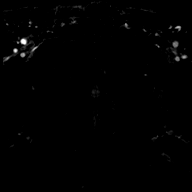

[Series 27: T1 · axial · 3.0mm · 1.15mm/px · 1 of 30 slices shown (16 of 46)]
[im 1/30]
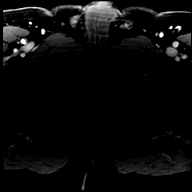

[Series 28: T1 · axial · 3.0mm · 1.15mm/px · 1 of 30 slices shown (17 of 46)]
[im 1/30]
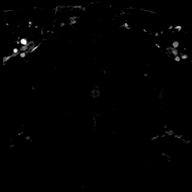

[Series 29: T1 · axial · 3.0mm · 1.15mm/px · 1 of 30 slices shown (18 of 46)]
[im 1/30]
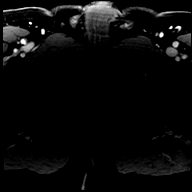

[Series 30: T1 · axial · 3.0mm · 1.15mm/px · 1 of 30 slices shown (19 of 46)]
[im 1/30]
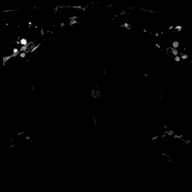

[Series 31: T1 · axial · 3.0mm · 1.15mm/px · 1 of 30 slices shown (20 of 46)]
[im 1/30]
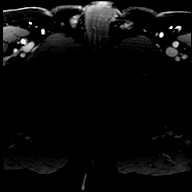

[Series 32: T1 · axial · 3.0mm · 1.15mm/px · 1 of 30 slices shown (21 of 46)]
[im 1/30]
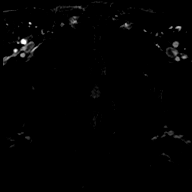

[Series 33: T1 · axial · 3.0mm · 1.15mm/px · 1 of 30 slices shown (22 of 46)]
[im 1/30]
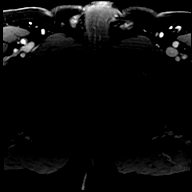

[Series 34: T1 · axial · 3.0mm · 1.15mm/px · 1 of 30 slices shown (23 of 46)]
[im 1/30]
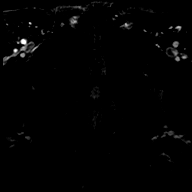

[Series 35: T1 · axial · 3.0mm · 1.15mm/px · 1 of 30 slices shown (24 of 46)]
[im 1/30]
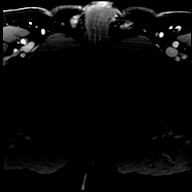

[Series 36: T1 · axial · 3.0mm · 1.15mm/px · 1 of 30 slices shown (25 of 46)]
[im 1/30]
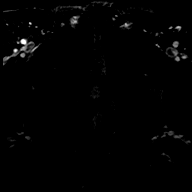

[Series 37: T1 · axial · 3.0mm · 1.15mm/px · 1 of 30 slices shown (26 of 46)]
[im 1/30]
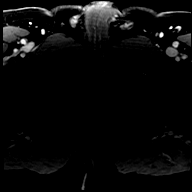

[Series 38: T1 · axial · 3.0mm · 1.15mm/px · 1 of 30 slices shown (27 of 46)]
[im 1/30]
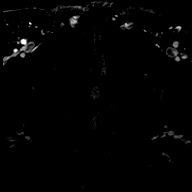

[Series 39: T1 · axial · 3.0mm · 1.15mm/px · 1 of 30 slices shown (28 of 46)]
[im 1/30]
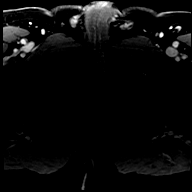

[Series 40: T1 · axial · 3.0mm · 1.15mm/px · 1 of 30 slices shown (29 of 46)]
[im 1/30]
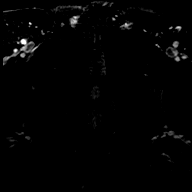

[Series 41: T1 · axial · 3.0mm · 1.15mm/px · 1 of 30 slices shown (30 of 46)]
[im 1/30]
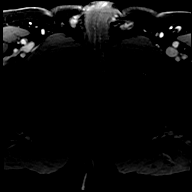

[Series 42: T1 · axial · 3.0mm · 1.15mm/px · 1 of 30 slices shown (31 of 46)]
[im 1/30]
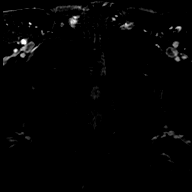

[Series 43: T1 · axial · 3.0mm · 1.15mm/px · 1 of 30 slices shown (32 of 46)]
[im 1/30]
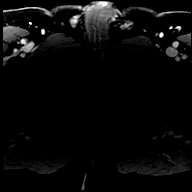

[Series 44: T1 · axial · 3.0mm · 1.15mm/px · 1 of 30 slices shown (33 of 46)]
[im 1/30]
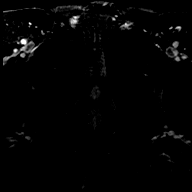

[Series 45: T1 · axial · 3.0mm · 1.15mm/px · 1 of 30 slices shown (34 of 46)]
[im 1/30]
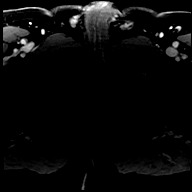

[Series 46: T1 · axial · 3.0mm · 1.15mm/px · 1 of 30 slices shown (35 of 46)]
[im 1/30]
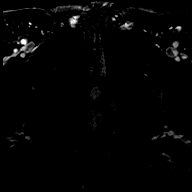

[Series 47: T1 · axial · 3.0mm · 1.15mm/px · 1 of 30 slices shown (36 of 46)]
[im 1/30]
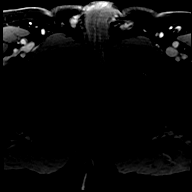

[Series 48: T1 · axial · 3.0mm · 1.15mm/px · 1 of 30 slices shown (37 of 46)]
[im 1/30]
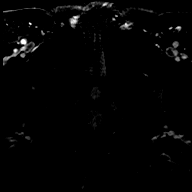

[Series 49: T1 · axial · 3.0mm · 1.15mm/px · 1 of 30 slices shown (38 of 46)]
[im 1/30]
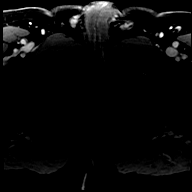

[Series 50: T1 · axial · 3.0mm · 1.15mm/px · 1 of 30 slices shown (39 of 46)]
[im 1/30]
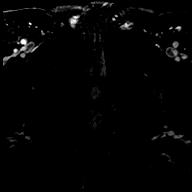

[Series 51: T1 · axial · 3.0mm · 1.15mm/px · 1 of 30 slices shown (40 of 46)]
[im 1/30]
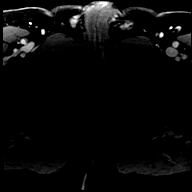

[Series 52: T1 · axial · 3.0mm · 1.15mm/px · 1 of 30 slices shown (41 of 46)]
[im 1/30]
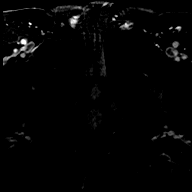

[Series 53: T1 · axial · 3.0mm · 1.15mm/px · 1 of 30 slices shown (42 of 46)]
[im 1/30]
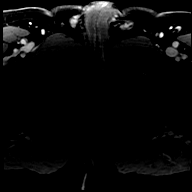

[Series 54: T1 · axial · 3.0mm · 1.15mm/px · 1 of 30 slices shown (43 of 46)]
[im 1/30]
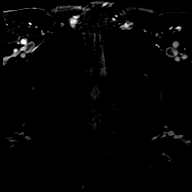

[Series 55: T1 · axial · 3.0mm · 1.15mm/px · 1 of 30 slices shown (44 of 46)]
[im 1/30]
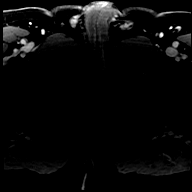

[Series 56: T1 · axial · 3.0mm · 1.15mm/px · 1 of 30 slices shown (45 of 46)]
[im 1/30]
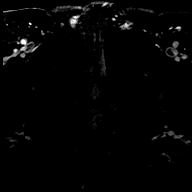

[Series 57: T1 · axial · 3.0mm · 1.15mm/px · 1 of 30 slices shown (46 of 46)]
[im 1/30]
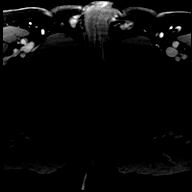

[56 of 56 positions shown; findings below may reference images not displayed]

FINDINGS: Prostate: Normal signal intensity within the peripheral zone on T2
weighted imaging. No foci of restricted diffusion in the peripheral
zone (series 9/series 10). No abnormal enhancement in peripheral
zone.

The transitional zone is markedly enlarged by the prostate nodules.
Nodule enlargement extends into the base the bladder (sagittal image
[DATE]). The nodules or capsulated without suspicious imaging
findings.

Prostatic capsule is intact.  Seminal vesicles normal.

Volume: 6.7 x 3.9 x 4.9 cm (volume = 67 cm^3)

Transcapsular spread:  Absent

Seminal vesicle involvement: Absent

Neurovascular bundle involvement: Absent

Pelvic adenopathy: Absent

Bone metastasis: Absent

Other findings: Absent
IMPRESSION: 1. No high-grade carcinoma in the peripheral zone ( PI-RADS: 1)
2. Nodular expansion of the zone most consistent benign prostate
hypertrophy ( PI-RADS: 1).
3. Prostate hypertrophy with nodular extension into the base the
bladder.

## 2021-03-27 SURGERY — PHACOEMULSIFICATION, CATARACT, WITH IOL INSERTION
Anesthesia: Monitor Anesthesia Care | Site: Eye | Laterality: Left

## 2021-03-27 MED ORDER — LIDOCAINE HCL (PF) 2 % IJ SOLN
INTRAOCULAR | Status: DC | PRN
  Administered 2021-03-27: 1 mL via INTRAOCULAR

## 2021-03-27 MED ORDER — SIGHTPATH DOSE#1 SODIUM HYALURONATE 23 MG/ML IO SOLUTION
PREFILLED_SYRINGE | INTRAOCULAR | Status: DC | PRN
  Administered 2021-03-27: .6 mL via INTRAOCULAR

## 2021-03-27 MED ORDER — ONDANSETRON HCL 4 MG/2ML IJ SOLN: 4.0000 mg | Freq: Once | INTRAMUSCULAR | Status: DC | PRN

## 2021-03-27 MED ORDER — PHENYLEPHRINE HCL 10 % OP SOLN
1.0000 [drp] | OPHTHALMIC | Status: AC
  Administered 2021-03-27 (×3): 1 [drp] via OPHTHALMIC

## 2021-03-27 MED ORDER — FENTANYL CITRATE (PF) 100 MCG/2ML IJ SOLN
INTRAMUSCULAR | Status: DC | PRN
  Administered 2021-03-27 (×2): 50 ug via INTRAVENOUS

## 2021-03-27 MED ORDER — TETRACAINE HCL 0.5 % OP SOLN
1.0000 [drp] | OPHTHALMIC | Status: DC | PRN
  Administered 2021-03-27 (×3): 1 [drp] via OPHTHALMIC

## 2021-03-27 MED ORDER — ACETAMINOPHEN 325 MG PO TABS
650.0000 mg | ORAL_TABLET | Freq: Once | ORAL | Status: DC | PRN
Start: 2021-03-27 — End: 2021-03-27

## 2021-03-27 MED ORDER — MIDAZOLAM HCL 2 MG/2ML IJ SOLN
INTRAMUSCULAR | Status: DC | PRN
  Administered 2021-03-27: 2 mg via INTRAVENOUS

## 2021-03-27 MED ORDER — SIGHTPATH DOSE#1 BSS IO SOLN
INTRAOCULAR | Status: DC | PRN
  Administered 2021-03-27: 71 mL via OPHTHALMIC

## 2021-03-27 MED ORDER — LACTATED RINGERS IV SOLN: INTRAVENOUS | Status: DC

## 2021-03-27 MED ORDER — ACETAMINOPHEN 160 MG/5ML PO SOLN: 325.0000 mg | ORAL | Status: DC | PRN

## 2021-03-27 MED ORDER — CYCLOPENTOLATE HCL 2 % OP SOLN
1.0000 [drp] | OPHTHALMIC | Status: AC
  Administered 2021-03-27 (×3): 1 [drp] via OPHTHALMIC

## 2021-03-27 MED ORDER — SIGHTPATH DOSE#1 SODIUM HYALURONATE 10 MG/ML IO SOLUTION
PREFILLED_SYRINGE | INTRAOCULAR | Status: DC | PRN
  Administered 2021-03-27: 0.55 mL via INTRAOCULAR

## 2021-03-27 MED ORDER — MOXIFLOXACIN HCL 0.5 % OP SOLN
OPHTHALMIC | Status: DC | PRN
  Administered 2021-03-27: 0.2 mL via OPHTHALMIC

## 2021-03-27 SURGICAL SUPPLY — 15 items
CANNULA ANT/CHMB 27GA (MISCELLANEOUS) ×4 IMPLANT
DISSECTOR HYDRO NUCLEUS 50X22 (MISCELLANEOUS) ×2 IMPLANT
GLOVE SURG ENC TEXT LTX SZ7.5 (GLOVE) ×2 IMPLANT
GLOVE SURG GAMMEX PI TX LF 7.5 (GLOVE) IMPLANT
GLOVE SURG SYN 8.5  E (GLOVE) ×1
GLOVE SURG SYN 8.5 E (GLOVE) ×1 IMPLANT
GOWN STRL REUS W/ TWL LRG LVL3 (GOWN DISPOSABLE) ×2 IMPLANT
GOWN STRL REUS W/TWL LRG LVL3 (GOWN DISPOSABLE) ×4
LENS IOL TECNIS EYHANCE 18.5 (Intraocular Lens) ×2 IMPLANT
MARKER SKIN DUAL TIP RULER LAB (MISCELLANEOUS) ×2 IMPLANT
PACK EYE AFTER SURG (MISCELLANEOUS) ×2 IMPLANT
SYR 3ML LL SCALE MARK (SYRINGE) ×2 IMPLANT
SYR TB 1ML LUER SLIP (SYRINGE) ×2 IMPLANT
WATER STERILE IRR 250ML POUR (IV SOLUTION) ×2 IMPLANT
WIPE NON LINTING 3.25X3.25 (MISCELLANEOUS) ×2 IMPLANT

## 2021-03-27 NOTE — Anesthesia Postprocedure Evaluation (Signed)
Anesthesia Post Note  Patient: Pedro Robinson  Procedure(s) Performed: CATARACT EXTRACTION PHACO AND INTRAOCULAR LENS PLACEMENT (IOC) LEFT 2.43 00:19.8 (Left: Eye)     Patient location during evaluation: PACU Anesthesia Type: MAC Level of consciousness: awake and alert, oriented and patient cooperative Pain management: pain level controlled Vital Signs Assessment: post-procedure vital signs reviewed and stable Respiratory status: spontaneous breathing, nonlabored ventilation and respiratory function stable Cardiovascular status: blood pressure returned to baseline and stable Postop Assessment: adequate PO intake Anesthetic complications: no   No notable events documented.  Darrin Nipper

## 2021-03-27 NOTE — Transfer of Care (Signed)
Immediate Anesthesia Transfer of Care Note  Patient: Pedro Robinson  Procedure(s) Performed: CATARACT EXTRACTION PHACO AND INTRAOCULAR LENS PLACEMENT (IOC) LEFT 2.43 00:19.8 (Left: Eye)  Patient Location: PACU  Anesthesia Type: MAC  Level of Consciousness: awake, alert  and patient cooperative  Airway and Oxygen Therapy: Patient Spontanous Breathing and Patient connected to supplemental oxygen  Post-op Assessment: Post-op Vital signs reviewed, Patient's Cardiovascular Status Stable, Respiratory Function Stable, Patent Airway and No signs of Nausea or vomiting  Post-op Vital Signs: Reviewed and stable  Complications: No notable events documented.

## 2021-03-27 NOTE — H&P (Signed)
[facility]   Primary Care Physician:  Sherron Monday, MD Ophthalmologist: Dr. Willey Blade  Pre-Procedure History & Physical: HPI:  Pedro Robinson is a 75 y.o. male here for cataract surgery.   Past Medical History:  Diagnosis Date   Chronic kidney disease    Gout    Hyperlipemia    Hypertension     Past Surgical History:  Procedure Laterality Date   CATARACT EXTRACTION W/PHACO Right 03/06/2021   Procedure: CATARACT EXTRACTION PHACO AND INTRAOCULAR LENS PLACEMENT (IOC) RIGHT 3.63 00:31.8;  Surgeon: Nevada Crane, MD;  Location: [facility];  Service: Ophthalmology;  Laterality: Right;   left foot      Prior to Admission medications   Medication Sig Start Date End Date Taking? Authorizing Provider  amLODipine-olmesartan (AZOR) 10-40 MG tablet Take 1 tablet by mouth daily. 08/22/19  Yes [provider]  B Complex-C-Folic Acid (HM SUPER VITAMIN B COMPLEX/C PO) Take by mouth daily.   Yes [provider]  cetirizine (ZYRTEC) 10 MG tablet Take 10 mg by mouth daily.   Yes [provider]  Cholecalciferol (VITAMIN D3) 1.25 MG (50000 UT) TABS Take 2,000 Units by mouth.   Yes [provider]  isosorbide mononitrate (IMDUR) 30 MG 24 hr tablet Take 30 mg by mouth daily. 12/24/19  Yes [provider]  latanoprost (XALATAN) 0.005 % ophthalmic solution 1 drop at bedtime.   Yes [provider]  Multiple Vitamins-Minerals (MULTIVITAMIN WITH MINERALS) tablet Take 1 tablet by mouth daily.   Yes [provider]  Omega-3 Fatty Acids (FISH OIL) 1000 MG CAPS Take by mouth.   Yes [provider]  rosuvastatin (CRESTOR) 5 MG tablet  11/02/15  Yes [provider]  sennosides-docusate sodium (SENOKOT-S) 8.6-50 MG tablet Take 1 tablet by mouth daily.   Yes [provider]  diazepam (VALIUM) 5 MG tablet 1 tab po 30 min prior to procedure 09/17/19   Stoioff, Verna Czech, MD  furosemide (LASIX) 40 MG  tablet Take by mouth. 08/12/19 03/06/21  [provider]  hydrALAZINE (APRESOLINE) 50 MG tablet  11/02/15   [provider]  HYDROcodone-acetaminophen (NORCO) 5-325 MG tablet Take 1 tablet by mouth every 6 (six) hours as needed for moderate pain. Patient not taking: Reported on 02/21/2021 01/21/21   Becky Augusta, NP  fluticasone Aleda Grana) 50 MCG/ACT nasal spray  10/13/18 11/12/19  [provider]  fluticasone (VERAMYST) 27.5 MCG/SPRAY nasal spray Place 2 sprays into the nose daily.  11/12/19  [provider]    Allergies as of 01/24/2021 - Review Complete 01/21/2021  Allergen Reaction Noted   Ivp dye [iodinated diagnostic agents] Nausea And Vomiting 10/23/2013    Family History  Problem Relation Age of Onset   Prostate cancer Brother    Kidney failure Mother    Lung cancer Brother    Bladder Cancer Neg Hx    Kidney cancer Neg Hx     Social History   Socioeconomic History   Marital status: Married    Spouse name: Not on file   Number of children: Not on file   Years of education: Not on file   Highest education level: Not on file  Occupational History   Not on file  Tobacco Use   Smoking status: Never   Smokeless tobacco: Never  Vaping Use   Vaping Use: Never used  Substance and Sexual Activity   Alcohol use: Yes    Comment: occasionally   Drug use: No  Sexual activity: Yes    Birth control/protection: None  Other Topics Concern   Not on file  Social History Narrative   Not on file   Social Determinants of Health   Financial Resource Strain: Not on file  Food Insecurity: Not on file  Transportation Needs: Not on file  Physical Activity: Not on file  Stress: Not on file  Social Connections: Not on file  Intimate Partner Violence: Not on file    Review of Systems: See HPI, otherwise negative ROS  Physical Exam: BP (!) 155/76   Pulse (!) 57   Temp 97.7 F (36.5 C) (Temporal)   Resp 18   Ht 6' (1.829 m)   Wt 88 kg   SpO2 98%    BMI 26.31 kg/m  General:   Alert, cooperative in NAD Head:  Normocephalic and atraumatic. Respiratory:  Normal work of breathing. Cardiovascular:  RRR  Impression/Plan: Pedro Robinson is here for cataract surgery.  Risks, benefits, limitations, and alternatives regarding cataract surgery have been reviewed with the patient.  Questions have been answered.  All parties agreeable.   Willey Blade, MD  03/27/2021, 11:59 AM

## 2021-03-27 NOTE — Op Note (Signed)
OPERATIVE NOTE  ALIM CATTELL [id number] 03/27/2021   PREOPERATIVE DIAGNOSIS:  Nuclear sclerotic cataract left eye.  H25.12   POSTOPERATIVE DIAGNOSIS:    Nuclear sclerotic cataract left eye.     PROCEDURE:  Phacoemusification with posterior chamber intraocular lens placement of the left eye   LENS:   Implant Name Type Inv. Item Serial No. Manufacturer Lot No. LRB No. Used Action  LENS IOL TECNIS EYHANCE 18.5 - [id number] Intraocular Lens LENS IOL TECNIS EYHANCE 18.5 [id number] JOHNSON   Left 1 Implanted      Procedure(s): CATARACT EXTRACTION PHACO AND INTRAOCULAR LENS PLACEMENT (IOC) LEFT 2.43 00:19.8 (Left)  DIB00 +18.5   ULTRASOUND TIME: 0 minutes 19 seconds.  CDE 2.43   SURGEON:  Willey Blade, MD, MPH   ANESTHESIA:  Topical with tetracaine drops augmented with 1% preservative-free intracameral lidocaine.  ESTIMATED BLOOD LOSS: <1 mL   COMPLICATIONS:  None.   DESCRIPTION OF PROCEDURE:  The patient was identified in the holding room and transported to the operating room and placed in the supine position under the operating microscope.  The left eye was identified as the operative eye and it was prepped and draped in the usual sterile ophthalmic fashion.   A 1.0 millimeter clear-corneal paracentesis was made at the 5:00 position. 0.5 ml of preservative-free 1% lidocaine with epinephrine was injected into the anterior chamber.  The anterior chamber was filled with Healon 5 viscoelastic.  A 2.4 millimeter keratome was used to make a near-clear corneal incision at the 2:00 position.  A curvilinear capsulorrhexis was made with a cystotome and capsulorrhexis forceps.  Balanced salt solution was used to hydrodissect and hydrodelineate the nucleus.   Phacoemulsification was then used in stop and chop fashion to remove the lens nucleus and epinucleus.  The remaining cortex was then removed using the irrigation and aspiration handpiece. Healon was then placed into the capsular bag to  distend it for lens placement.  A lens was then injected into the capsular bag.  The remaining viscoelastic was aspirated.   Wounds were hydrated with balanced salt solution.  The anterior chamber was inflated to a physiologic pressure with balanced salt solution.  Intracameral vigamox 0.1 mL undiltued was injected into the eye and a drop placed onto the ocular surface.  No wound leaks were noted.  The patient was taken to the recovery room in stable condition without complications of anesthesia or surgery  Willey Blade 03/27/2021, 12:25 PM

## 2021-03-27 NOTE — Anesthesia Procedure Notes (Signed)
Procedure Name: MAC Date/Time: 03/27/2021 12:08 PM Performed by: Jeannene Patella, CRNA Pre-anesthesia Checklist: Patient identified, Emergency Drugs available, Suction available, Timeout performed and Patient being monitored Patient Re-evaluated:Patient Re-evaluated prior to induction Oxygen Delivery Method: Nasal cannula Placement Confirmation: positive ETCO2

## 2021-03-27 NOTE — Anesthesia Preprocedure Evaluation (Addendum)
Anesthesia Evaluation  Patient identified by MRN, date of birth, ID band Patient awake    Reviewed: Allergy & Precautions, NPO status , Patient's Chart, lab work & pertinent test results  History of Anesthesia Complications Negative for: history of anesthetic complications  Airway Mallampati: II   Neck ROM: Full    Dental   Missing lower molars x2:   Pulmonary neg pulmonary ROS,    Pulmonary exam normal breath sounds clear to auscultation       Cardiovascular hypertension, Normal cardiovascular exam Rhythm:Regular Rate:Normal     Neuro/Psych negative neurological ROS     GI/Hepatic negative GI ROS,   Endo/Other  negative endocrine ROS  Renal/GU Renal disease (stage III CKD)     Musculoskeletal  (+) Arthritis , Gout    Abdominal   Peds  Hematology negative hematology ROS (+)   Anesthesia Other Findings   Reproductive/Obstetrics                            Anesthesia Physical Anesthesia Plan  ASA: 2  Anesthesia Plan: MAC   Post-op Pain Management:    Induction: Intravenous  PONV Risk Score and Plan: 1 and TIVA, Midazolam and Treatment may vary due to age or medical condition  Airway Management Planned: Nasal Cannula  Additional Equipment:   Intra-op Plan:   Post-operative Plan:   Informed Consent: I have reviewed the patients History and Physical, chart, labs and discussed the procedure including the risks, benefits and alternatives for the proposed anesthesia with the patient or authorized representative who has indicated his/her understanding and acceptance.       Plan Discussed with: CRNA  Anesthesia Plan Comments:        Anesthesia Quick Evaluation

## 2021-03-30 ENCOUNTER — Encounter: Payer: Self-pay | Admitting: Ophthalmology

## 2021-05-25 ENCOUNTER — Other Ambulatory Visit: Payer: Self-pay

## 2021-05-25 ENCOUNTER — Encounter: Payer: Self-pay | Admitting: Emergency Medicine

## 2021-05-25 ENCOUNTER — Ambulatory Visit
Admission: EM | Admit: 2021-05-25 | Discharge: 2021-05-25 | Disposition: A | Discharge status: Home / Self Care | Payer: BC Managed Care – PPO | Attending: Physician Assistant | Admitting: Physician Assistant

## 2021-05-25 DIAGNOSIS — M109 Gout, unspecified: Secondary | ICD-10-CM

## 2021-05-25 MED ORDER — PREDNISONE 10 MG PO TABS: ORAL_TABLET | ORAL | 0 refills | Status: DC

## 2021-05-25 NOTE — ED Triage Notes (Signed)
Pt presents today with c/o of pain/burning to left foot, primarily left big toe x 2-3 days. He believes he is having a flare up of gout. Denies injury.

## 2021-05-25 NOTE — ED Provider Notes (Signed)
[facility]    CSN: [id number] Arrival date & time: 05/25/21  5784      History   Chief Complaint Chief Complaint  Patient presents with   Foot Pain    left    HPI Pedro Robinson is a 75 y.o. male presenting for 2 to 3-day history of pain and swelling of the left great toe.  Patient has a history of gout and believes he is having a gout flareup.  He says that he did indulge in a T-bone steak for symptoms started.  He has taken Tylenol for pain relief but thinks he needs something stronger.  He does admit to history of chronic kidney disease and says he cannot take NSAIDs.  He admits to about 2 gout flareups per year.  States he used to be on allopurinol but no longer takes it and is not sure if he wants to go back on it.  Presently he admits to some increased pain on ambulating.  He does use a cane.  He denies any numbness or tingling.  He says the pain is burning.  He has no other complaints.  HPI  Past Medical History:  Diagnosis Date   Chronic kidney disease    Gout    Hyperlipemia    Hypertension     Patient Active Problem List   Diagnosis Date Noted   Acute gout of right foot 05/25/2021   Elevated PSA 09/04/2019   Benign prostatic hyperplasia without lower urinary tract symptoms 09/04/2019    Past Surgical History:  Procedure Laterality Date   CATARACT EXTRACTION W/PHACO Right 03/06/2021   Procedure: CATARACT EXTRACTION PHACO AND INTRAOCULAR LENS PLACEMENT (IOC) RIGHT 3.63 00:31.8;  Surgeon: Nevada Crane, MD;  Location: [facility];  Service: Ophthalmology;  Laterality: Right;   CATARACT EXTRACTION W/PHACO Left 03/27/2021   Procedure: CATARACT EXTRACTION PHACO AND INTRAOCULAR LENS PLACEMENT (IOC) LEFT 2.43 00:19.8;  Surgeon: Nevada Crane, MD;  Location: [facility];  Service: Ophthalmology;  Laterality: Left;   left foot         Home Medications    Prior to Admission medications   Medication Sig Start Date End Date  Taking? Authorizing Provider  predniSONE (DELTASONE) 10 MG tablet Take 4 tabs PO x 2 days, 3 tabs PO x 2 days, 2 tabs PO x 2 days, 1 tab PO x 2 days 05/25/21  Yes Shirlee Latch, PA-C  amLODipine-olmesartan (AZOR) 10-40 MG tablet Take 1 tablet by mouth daily. 08/22/19   [provider]  B Complex-C-Folic Acid (HM SUPER VITAMIN B COMPLEX/C PO) Take by mouth daily.    [provider]  cetirizine (ZYRTEC) 10 MG tablet Take 10 mg by mouth daily.    [provider]  Cholecalciferol (VITAMIN D3) 1.25 MG (50000 UT) TABS Take 2,000 Units by mouth.    [provider]  diazepam (VALIUM) 5 MG tablet 1 tab po 30 min prior to procedure 09/17/19   Stoioff, Verna Czech, MD  furosemide (LASIX) 40 MG tablet Take by mouth. 08/12/19 03/06/21  [provider]  hydrALAZINE (APRESOLINE) 50 MG tablet  11/02/15   [provider]  HYDROcodone-acetaminophen (NORCO) 5-325 MG tablet Take 1 tablet by mouth every 6 (six) hours as needed for moderate pain. Patient not taking: Reported on 02/21/2021 01/21/21   Becky Augusta, NP  isosorbide mononitrate (IMDUR) 30 MG 24 hr tablet Take 30 mg by mouth daily. 12/24/19   [provider]  latanoprost (XALATAN) 0.005 % ophthalmic  solution 1 drop at bedtime.    [provider]  Multiple Vitamins-Minerals (MULTIVITAMIN WITH MINERALS) tablet Take 1 tablet by mouth daily.    [provider]  Omega-3 Fatty Acids (FISH OIL) 1000 MG CAPS Take by mouth.    [provider]  rosuvastatin (CRESTOR) 5 MG tablet  11/02/15   [provider]  sennosides-docusate sodium (SENOKOT-S) 8.6-50 MG tablet Take 1 tablet by mouth daily.    [provider]  fluticasone Aleda Grana) 50 MCG/ACT nasal spray  10/13/18 11/12/19  [provider]  fluticasone (VERAMYST) 27.5 MCG/SPRAY nasal spray Place 2 sprays into the nose daily.  11/12/19  [provider]    Family History Family History  Problem Relation Age  of Onset   Prostate cancer Brother    Kidney failure Mother    Lung cancer Brother    Bladder Cancer Neg Hx    Kidney cancer Neg Hx     Social History Social History   Tobacco Use   Smoking status: Never   Smokeless tobacco: Never  Vaping Use   Vaping Use: Never used  Substance Use Topics   Alcohol use: Yes    Comment: occasionally   Drug use: No     Allergies   Ivp dye [iodinated diagnostic agents], Nsaids, and Clonidine   Review of Systems Review of Systems  Constitutional:  Negative for fatigue and fever.  Musculoskeletal:  Positive for arthralgias, gait problem and joint swelling.  Skin:  Positive for color change.  Neurological:  Negative for weakness and numbness.    Physical Exam Triage Vital Signs ED Triage Vitals  Enc Vitals Group     BP 05/25/21 0841 (!) 152/78     Pulse Rate 05/25/21 0841 61     Resp 05/25/21 0841 20     Temp 05/25/21 0841 98.5 F (36.9 C)     Temp Source 05/25/21 0841 Oral     SpO2 05/25/21 0841 100 %     Weight --      Height --      Head Circumference --      Peak Flow --      Pain Score 05/25/21 0838 6     Pain Loc --      Pain Edu? --      Excl. in [place]? --    No data found.  Updated Vital Signs BP (!) 152/78 (BP Location: Right Arm)   Pulse 61   Temp 98.5 F (36.9 C) (Oral)   Resp 20   SpO2 100%   Physical Exam Vitals and nursing note reviewed.  Constitutional:      General: He is not in acute distress.    Appearance: Normal appearance. He is well-developed. He is ill-appearing.  HENT:     Head: Normocephalic and atraumatic.  Eyes:     General: No scleral icterus.    Conjunctiva/sclera: Conjunctivae normal.  Cardiovascular:     Rate and Rhythm: Normal rate and regular rhythm.     Pulses: Normal pulses.  Pulmonary:     Effort: Pulmonary effort is normal. No respiratory distress.  Musculoskeletal:     Cervical back: Neck supple.     Left foot: Decreased range of motion (great toe and foot mildly due to  pain). Swelling (mild swelling MTP great toe) and bony tenderness (1st MTP and midfoot) present. Normal pulse.     Comments: Mild erythema and warmth to 1st MTP  Skin:    General: Skin is warm and dry.  Neurological:     General: No focal deficit present.     Mental Status: He is alert. Mental status is at baseline.     Motor: No weakness.     Coordination: Coordination normal.     Gait: Gait abnormal.  Psychiatric:        Mood and Affect: Mood normal.        Thought Content: Thought content normal.     UC Treatments / Results  Labs (all labs ordered are listed, but only abnormal results are displayed) Labs Reviewed - No data to display  EKG   Radiology No results found.  Procedures Procedures (including critical care time)  Medications Ordered in UC Medications - No data to display  Initial Impression / Assessment and Plan / UC Course  I have reviewed the triage vital signs and the nursing notes.  Pertinent labs & imaging results that were available during my care of the patient were reviewed by me and considered in my medical decision making (see chart for details).  75 year old male presenting for left great toe pain and left foot dorsal pain for the past 2 to 3 days.  Has a history of gout.  His clinical presentation today is consistent with a acute gout flareup.  Treating patient at this time with prednisone taper over 8 days.  Reviewed following RICE guidelines and he can also take Tylenol as needed for pain.  Advised following up with PCP to consider resuming allopurinol.  Return and ED precautions reviewed.   Final Clinical Impressions(s) / UC Diagnoses   Final diagnoses:  Acute gout involving toe of left foot, unspecified cause  Acute gout of left foot, unspecified cause     Discharge Instructions      -You do appear to be having a flareup of your gout affecting the right great toe and foot.  I have sent in prednisone.  Take according to  instructions. -Ice and elevate the foot to help with pain relief and you can also take Tylenol if needed. -If you are having more than 2 flareups per year you should discuss with your PCP about trying allopurinol again. -If you are not improving the next 2 days or your condition worsens you should be seen again.  Try to see her PCP but if you cannot see PCP then return to our clinic.      ED Prescriptions     Medication Sig Dispense Auth. Provider   predniSONE (DELTASONE) 10 MG tablet Take 4 tabs PO x 2 days, 3 tabs PO x 2 days, 2 tabs PO x 2 days, 1 tab PO x 2 days 20 tablet Shirlee Latch, PA-C      PDMP not reviewed this encounter.   Shirlee Latch, PA-C 05/25/21 [id number]

## 2021-05-25 NOTE — Discharge Instructions (Addendum)
-  You do appear to be having a flareup of your gout affecting the right great toe and foot.  I have sent in prednisone.  Take according to instructions. -Ice and elevate the foot to help with pain relief and you can also take Tylenol if needed. -If you are having more than 2 flareups per year you should discuss with your PCP about trying allopurinol again. -If you are not improving the next 2 days or your condition worsens you should be seen again.  Try to see her PCP but if you cannot see PCP then return to our clinic.

## 2021-11-04 ENCOUNTER — Other Ambulatory Visit: Payer: Self-pay

## 2021-11-04 ENCOUNTER — Ambulatory Visit
Admission: EM | Admit: 2021-11-04 | Discharge: 2021-11-04 | Disposition: A | Discharge status: Home / Self Care | Payer: BC Managed Care – PPO | Attending: Emergency Medicine | Admitting: Emergency Medicine

## 2021-11-04 DIAGNOSIS — M10071 Idiopathic gout, right ankle and foot: Secondary | ICD-10-CM

## 2021-11-04 MED ORDER — PREDNISONE 10 MG PO TABS: ORAL_TABLET | ORAL | 0 refills | Status: DC

## 2021-11-04 NOTE — ED Provider Notes (Signed)
[facility]    CSN: [id number] Arrival date & time: 11/04/21  1457      History   Chief Complaint Chief Complaint  Patient presents with   Gait Problem    HPI Pedro Robinson is a 76 y.o. male.    Patient presents with right foot midfoot  pain and swelling for three days.  Symptoms are worse at nighttime.  Painful to bear weight,has been using his cane for support. ROM intact. Has attempted use of tylenol which has not been helpful. History of gout, endorses an increase of  beef and wine.  Denies numbness, tingling, trauma, injury.     Past Medical History:  Diagnosis Date   Chronic kidney disease    Gout    Hyperlipemia    Hypertension     Patient Active Problem List   Diagnosis Date Noted   Acute gout of right foot 05/25/2021   Elevated PSA 09/04/2019   Benign prostatic hyperplasia without lower urinary tract symptoms 09/04/2019    Past Surgical History:  Procedure Laterality Date   CATARACT EXTRACTION W/PHACO Right 03/06/2021   Procedure: CATARACT EXTRACTION PHACO AND INTRAOCULAR LENS PLACEMENT (IOC) RIGHT 3.63 00:31.8;  Surgeon: Nevada Crane, MD;  Location: [facility];  Service: Ophthalmology;  Laterality: Right;   CATARACT EXTRACTION W/PHACO Left 03/27/2021   Procedure: CATARACT EXTRACTION PHACO AND INTRAOCULAR LENS PLACEMENT (IOC) LEFT 2.43 00:19.8;  Surgeon: Nevada Crane, MD;  Location: [facility];  Service: Ophthalmology;  Laterality: Left;   left foot         Home Medications    Prior to Admission medications   Medication Sig Start Date End Date Taking? Authorizing Provider  amLODipine-olmesartan (AZOR) 10-40 MG tablet Take 1 tablet by mouth daily. 08/22/19  Yes [provider]  B Complex-C-Folic Acid (HM SUPER VITAMIN B COMPLEX/C PO) Take by mouth daily.   Yes [provider]  cetirizine (ZYRTEC) 10 MG tablet Take 10 mg by mouth daily.   Yes [provider]  Cholecalciferol (VITAMIN  D3) 1.25 MG (50000 UT) TABS Take 2,000 Units by mouth.   Yes [provider]  hydrALAZINE (APRESOLINE) 50 MG tablet  11/02/15  Yes [provider]  isosorbide mononitrate (IMDUR) 30 MG 24 hr tablet Take 30 mg by mouth daily. 12/24/19  Yes [provider]  latanoprost (XALATAN) 0.005 % ophthalmic solution 1 drop at bedtime.   Yes [provider]  Multiple Vitamins-Minerals (MULTIVITAMIN WITH MINERALS) tablet Take 1 tablet by mouth daily.   Yes [provider]  Omega-3 Fatty Acids (FISH OIL) 1000 MG CAPS Take by mouth.   Yes [provider]  rosuvastatin (CRESTOR) 5 MG tablet  11/02/15  Yes [provider]  sennosides-docusate sodium (SENOKOT-S) 8.6-50 MG tablet Take 1 tablet by mouth daily.   Yes [provider]  diazepam (VALIUM) 5 MG tablet 1 tab po 30 min prior to procedure 09/17/19   Stoioff, Verna Czech, MD  furosemide (LASIX) 40 MG tablet Take by mouth. 08/12/19 03/06/21  [provider]  HYDROcodone-acetaminophen (NORCO) 5-325 MG tablet Take 1 tablet by mouth every 6 (six) hours as needed for moderate pain. Patient not taking: Reported on 02/21/2021 01/21/21   Becky Augusta, NP  predniSONE (DELTASONE) 10 MG tablet Take 4 tabs PO x 2 days, 3 tabs PO x 2 days, 2 tabs PO x 2 days, 1 tab PO x 2 days 05/25/21   Eusebio Friendly B, PA-C  fluticasone [facility]) 50 MCG/ACT nasal  spray  10/13/18 11/12/19  [provider]  fluticasone (VERAMYST) 27.5 MCG/SPRAY nasal spray Place 2 sprays into the nose daily.  11/12/19  [provider]    Family History Family History  Problem Relation Age of Onset   Prostate cancer Brother    Kidney failure Mother    Lung cancer Brother    Bladder Cancer Neg Hx    Kidney cancer Neg Hx     Social History Social History   Tobacco Use   Smoking status: Never   Smokeless tobacco: Never  Vaping Use   Vaping Use: Never used  Substance Use Topics   Alcohol use: Yes    Comment:  occasionally   Drug use: No     Allergies   Ivp dye [iodinated contrast media], Nsaids, and Clonidine   Review of Systems Review of Systems  Constitutional: Negative.   Respiratory: Negative.    Cardiovascular: Negative.   Skin: Negative.   Neurological: Negative.     Physical Exam Triage Vital Signs ED Triage Vitals  Enc Vitals Group     BP 11/04/21 1513 (!) 145/77     Pulse Rate 11/04/21 1513 (!) 59     Resp 11/04/21 1513 18     Temp 11/04/21 1513 98.6 F (37 C)     Temp Source 11/04/21 1513 Oral     SpO2 11/04/21 1513 100 %     Weight 11/04/21 1511 200 lb (90.7 kg)     Height 11/04/21 1511 6' (1.829 m)     Head Circumference --      Peak Flow --      Pain Score 11/04/21 1511 6     Pain Loc --      Pain Edu? --      Excl. in [place]? --    No data found.  Updated Vital Signs BP (!) 145/77 (BP Location: Left Arm)    Pulse (!) 59    Temp 98.6 F (37 C) (Oral)    Resp 18    Ht 6' (1.829 m)    Wt 200 lb (90.7 kg)    SpO2 100%    BMI 27.12 kg/m   Visual Acuity Right Eye Distance:   Left Eye Distance:   Bilateral Distance:    Right Eye Near:   Left Eye Near:    Bilateral Near:     Physical Exam Constitutional:      Appearance: Normal appearance.  HENT:     Head: Normocephalic.  Eyes:     Extraocular Movements: Extraocular movements intact.  Musculoskeletal:       Feet:  Feet:     Comments: Erythema and tenderness over the dorsal aspect of the right midfoot, 2+ pedal pulse, sensation intact, range of motion intact Skin:    General: Skin is warm and dry.  Neurological:     Mental Status: He is alert and oriented to person, place, and time. Mental status is at baseline.  Psychiatric:        Mood and Affect: Mood normal.        Behavior: Behavior normal.     UC Treatments / Results  Labs (all labs ordered are listed, but only abnormal results are displayed) Labs Reviewed - No data to display  EKG   Radiology No results  found.  Procedures Procedures (including critical care time)  Medications Ordered in UC Medications - No data to display  Initial Impression / Assessment and Plan / UC Course  I have reviewed the  triage vital signs and the nursing notes.  Pertinent labs & imaging results that were available during my care of the patient were reviewed by me and considered in my medical decision making (see chart for details).  Acute idiopathic gout of right foot  With lack of precipitating event, injury or trauma, will defer imaging at this time and move forward with treatment of gout due to patient's history, prednisone taper prescribed as patient endorses he has had success with this medication in the past, recommended ice in 15-minute intervals as patient endorses that heat is intolerable, recommended RICE, compression and elevation for additional support, may follow-up with urgent care PCP as needed Final Clinical Impressions(s) / UC Diagnoses   Final diagnoses:  None   Discharge Instructions   None    ED Prescriptions   None    PDMP not reviewed this encounter.   Hans Eden, NP 11/04/21 1531

## 2021-11-04 NOTE — ED Triage Notes (Signed)
Pt c/o a gout flare up in right foot x3days

## 2021-11-04 NOTE — Discharge Instructions (Signed)
You may begin use of prednisone taking medicine daily as directed  You can continue use of ice over the affected area and 15-minute intervals  If able to tolerate you may wrap the area with compression for comfort  You may place foot onto elevated surfaces while sitting or lying to help reduce swelling  You may follow-up with your urgent care primary care doctor if symptoms continue to persist or worsen

## 2022-02-26 ENCOUNTER — Other Ambulatory Visit: Payer: Self-pay

## 2022-02-26 ENCOUNTER — Other Ambulatory Visit: Payer: Medicare Other

## 2022-02-26 ENCOUNTER — Other Ambulatory Visit: Payer: Self-pay | Admitting: *Deleted

## 2022-02-26 DIAGNOSIS — R972 Elevated prostate specific antigen [PSA]: Secondary | ICD-10-CM

## 2022-02-27 LAB — PSA: Prostate Specific Ag, Serum: 7.1 ng/mL — ABNORMAL HIGH (ref 0.0–4.0)

## 2022-03-02 ENCOUNTER — Other Ambulatory Visit: Payer: Self-pay

## 2022-03-02 ENCOUNTER — Encounter: Payer: Self-pay | Admitting: Urology

## 2022-03-02 ENCOUNTER — Ambulatory Visit: Payer: Medicare Other | Admitting: Urology

## 2022-03-02 VITALS — BP 172/87 | HR 54 | Ht 72.0 in | Wt 200.0 lb

## 2022-03-02 DIAGNOSIS — N401 Enlarged prostate with lower urinary tract symptoms: Secondary | ICD-10-CM | POA: Diagnosis not present

## 2022-03-02 DIAGNOSIS — R972 Elevated prostate specific antigen [PSA]: Secondary | ICD-10-CM | POA: Diagnosis not present

## 2022-03-02 NOTE — Progress Notes (Signed)
03/02/2022 11:35 AM   Pedro Robinson [birth year] [id number]  Referring provider: Sherron Monday, MD [address] [place],  [place] [address]  Chief Complaint  Patient presents with   Elevated PSA    Urologic history: 1.  Elevated PSA -Biopsy October 2018 PSA 5.2; 82 g prostate; benign pathology -Prostate MRI 09/2019 for PSA bump 8.2; 67 g gland; PI-RADS 1 lesions   HPI: 76 y.o. male presents for annual follow-up.  Doing well without problems since last years visit Denies dysuria, gross hematuria Denies flank, abdominal or pelvic pain PSA 02/26/2022 stable at 7.1     PMH: Past Medical History:  Diagnosis Date   Chronic kidney disease    Gout    Hyperlipemia    Hypertension     Surgical History: Past Surgical History:  Procedure Laterality Date   CATARACT EXTRACTION W/PHACO Right 03/06/2021   Procedure: CATARACT EXTRACTION PHACO AND INTRAOCULAR LENS PLACEMENT (IOC) RIGHT 3.63 00:31.8;  Surgeon: Nevada Crane, MD;  Location: [facility];  Service: Ophthalmology;  Laterality: Right;   CATARACT EXTRACTION W/PHACO Left 03/27/2021   Procedure: CATARACT EXTRACTION PHACO AND INTRAOCULAR LENS PLACEMENT (IOC) LEFT 2.43 00:19.8;  Surgeon: Nevada Crane, MD;  Location: [facility];  Service: Ophthalmology;  Laterality: Left;   left foot      Home Medications:  Allergies as of 03/02/2022       Reactions   Ivp Dye [iodinated Contrast Media] Nausea And Vomiting   Hot flashes    Nsaids Other (See Comments)   Pt reports not able to take due to kidney disease   Clonidine Rash   Patch        Medication List        Accurate as of March 02, 2022 11:35 AM. If you have any questions, ask your nurse or doctor.          STOP taking these medications    isosorbide mononitrate 30 MG 24 hr tablet Commonly known as: IMDUR Stopped by: [name], MD   predniSONE 10 MG tablet Commonly known as: DELTASONE Stopped by: [name],  MD       TAKE these medications    amLODipine-olmesartan 10-40 MG tablet Commonly known as: AZOR Take 1 tablet by mouth daily.   cetirizine 10 MG tablet Commonly known as: ZYRTEC Take 10 mg by mouth daily.   Fish Oil 1000 MG Caps Take by mouth.   furosemide 40 MG tablet Commonly known as: LASIX Take by mouth.   HM SUPER VITAMIN B COMPLEX/C PO Take by mouth daily.   hydrALAZINE 50 MG tablet Commonly known as: APRESOLINE   latanoprost 0.005 % ophthalmic solution Commonly known as: XALATAN 1 drop at bedtime.   multivitamin with minerals tablet Take 1 tablet by mouth daily.   rosuvastatin 10 MG tablet Commonly known as: CRESTOR Take 10 mg by mouth at bedtime. What changed: Another medication with the same name was removed. Continue taking this medication, and follow the directions you see here. Changed by: [name], MD   sennosides-docusate sodium 8.6-50 MG tablet Commonly known as: SENOKOT-S Take 1 tablet by mouth daily.   Vitamin D3 1.25 MG (50000 UT) Tabs Take 2,000 Units by mouth.        Allergies:  Allergies  Allergen Reactions   Ivp Dye [Iodinated Contrast Media] Nausea And Vomiting    Hot flashes    Nsaids Other (See Comments)    Pt reports not able to take due  to kidney disease   Clonidine Rash    Patch    Family History: Family History  Problem Relation Age of Onset   Prostate cancer Brother    Kidney failure Mother    Lung cancer Brother    Bladder Cancer Neg Hx    Kidney cancer Neg Hx     Social History:  reports that he has never smoked. He has never used smokeless tobacco. He reports current alcohol use. He reports that he does not use drugs.   Physical Exam: BP (!) 172/87 (BP Location: Left Arm, Patient Position: Sitting, Cuff Size: Large) Comment: No BP meds  Pulse (!) 54   Ht 6' (1.829 m)   Wt 200 lb (90.7 kg)   BMI 27.12 kg/m   Constitutional:  Alert and oriented, No acute distress. HEENT: [place] AT Respiratory:  Normal respiratory effort, no increased work of breathing. GU: Prostate 60 g, smooth without nodules Lymph: No cervical or inguinal lymphadenopathy. Skin: No rashes, bruises or suspicious lesions. Neurologic: Grossly intact, no focal deficits, moving all 4 extremities. Psychiatric: Normal mood and affect.   Assessment & Plan:    1.  Elevated PSA Stable Benign DRE Continue annual follow-up  2.  BPH without LUTS   [name], MD  [facility] [address] [place], [place] [address] [phone]

## 2022-05-02 DIAGNOSIS — R262 Difficulty in walking, not elsewhere classified: Secondary | ICD-10-CM | POA: Diagnosis not present

## 2022-05-02 DIAGNOSIS — H40003 Preglaucoma, unspecified, bilateral: Secondary | ICD-10-CM | POA: Diagnosis not present

## 2022-05-02 DIAGNOSIS — G8929 Other chronic pain: Secondary | ICD-10-CM | POA: Diagnosis not present

## 2022-05-02 DIAGNOSIS — M25562 Pain in left knee: Secondary | ICD-10-CM | POA: Diagnosis not present

## 2022-05-02 DIAGNOSIS — M25561 Pain in right knee: Secondary | ICD-10-CM | POA: Diagnosis not present

## 2022-05-03 DIAGNOSIS — M170 Bilateral primary osteoarthritis of knee: Secondary | ICD-10-CM | POA: Diagnosis not present

## 2022-05-03 DIAGNOSIS — M25562 Pain in left knee: Secondary | ICD-10-CM | POA: Diagnosis not present

## 2022-05-03 DIAGNOSIS — M1129 Other chondrocalcinosis, multiple sites: Secondary | ICD-10-CM | POA: Diagnosis not present

## 2022-05-03 DIAGNOSIS — G8929 Other chronic pain: Secondary | ICD-10-CM | POA: Diagnosis not present

## 2022-05-03 DIAGNOSIS — M25561 Pain in right knee: Secondary | ICD-10-CM | POA: Diagnosis not present

## 2023-03-01 ENCOUNTER — Other Ambulatory Visit: Payer: Medicare Other

## 2023-03-01 DIAGNOSIS — R972 Elevated prostate specific antigen [PSA]: Secondary | ICD-10-CM

## 2023-03-02 LAB — PSA: Prostate Specific Ag, Serum: 8.6 ng/mL — ABNORMAL HIGH (ref 0.0–4.0)

## 2023-03-06 ENCOUNTER — Encounter: Payer: Self-pay | Admitting: Urology

## 2023-03-06 ENCOUNTER — Ambulatory Visit: Payer: Medicare Other | Admitting: Urology

## 2023-03-06 VITALS — BP 174/80 | HR 61 | Ht 72.0 in | Wt 198.0 lb

## 2023-03-06 DIAGNOSIS — R972 Elevated prostate specific antigen [PSA]: Secondary | ICD-10-CM | POA: Diagnosis not present

## 2023-03-06 NOTE — Progress Notes (Signed)
I, Duke Salvia, acting as a scribe for [name], MD., have documented all relevant documentation on the behalf of [name], MD, as directed by  [name], MD while in the presence of [name], MD.   03/06/2023 12:24 PM   Pedro Robinson 06/05/1946 [id number]  Referring provider: Sherron Monday, MD [address] [place],  [place] [address]  Chief Complaint  Patient presents with   Elevated PSA    Urologic history: 1.  Elevated PSA -Biopsy October 2018 PSA 5.2; 82 g prostate; benign pathology -Prostate MRI 09/2019 for PSA bump 8.2; 67 g gland; PI-RADS 1 lesions  HPI: 77 y.o. male presents for annual follow-up.  Doing well without problems since last years visit Denies dysuria, gross hematuria Denies flank, abdominal or pelvic pain PSA 03/01/2023 8.6   Prostate Specific Ag, Serum  Latest Ref Rng 0.0 - 4.0 ng/mL  07/16/2018 5.0 (H)   01/21/2019 6.0 (H)   07/20/2019 7.3 (H)   09/04/2019 8.4 (H)   02/22/2020 6.7 (H)   02/21/2021 6.4 (H)   02/26/2022 7.1 (H)   03/01/2023 8.6 (H)      PMH: Past Medical History:  Diagnosis Date   Chronic kidney disease    Gout    Hyperlipemia    Hypertension     Surgical History: Past Surgical History:  Procedure Laterality Date   CATARACT EXTRACTION W/PHACO Right 03/06/2021   Procedure: CATARACT EXTRACTION PHACO AND INTRAOCULAR LENS PLACEMENT (IOC) RIGHT 3.63 00:31.8;  Surgeon: Nevada Crane, MD;  Location: [facility];  Service: Ophthalmology;  Laterality: Right;   CATARACT EXTRACTION W/PHACO Left 03/27/2021   Procedure: CATARACT EXTRACTION PHACO AND INTRAOCULAR LENS PLACEMENT (IOC) LEFT 2.43 00:19.8;  Surgeon: Nevada Crane, MD;  Location: [facility];  Service: Ophthalmology;  Laterality: Left;   left foot      Home Medications:  Allergies as of 03/06/2023       Reactions   Ivp Dye [iodinated Contrast Media] Nausea And Vomiting   Hot flashes    Nsaids Other (See Comments)    Pt reports not able to take due to kidney disease   Clonidine Rash   Patch        Medication List        Accurate as of March 06, 2023 12:24 PM. If you have any questions, ask your nurse or doctor.          STOP taking these medications    furosemide 40 MG tablet Commonly known as: LASIX Stopped by: [name], MD   sennosides-docusate sodium 8.6-50 MG tablet Commonly known as: SENOKOT-S Stopped by: [name], MD       TAKE these medications    amLODipine-olmesartan 10-40 MG tablet Commonly known as: AZOR Take 1 tablet by mouth daily.   cetirizine 10 MG tablet Commonly known as: ZYRTEC Take 10 mg by mouth daily.   Fish Oil 1000 MG Caps Take by mouth.   HM SUPER VITAMIN B COMPLEX/C PO Take by mouth daily.   hydrALAZINE 50 MG tablet Commonly known as: APRESOLINE   latanoprost 0.005 % ophthalmic solution Commonly known as: XALATAN 1 drop at bedtime.   multivitamin with minerals tablet Take 1 tablet by mouth daily.   rosuvastatin 10 MG tablet Commonly known as: CRESTOR Take 10 mg by mouth at bedtime.   Vitamin D3 1.25 MG (50000 UT) Tabs Take 2,000 Units by mouth.        Allergies:  Allergies  Allergen Reactions   Ivp Dye [Iodinated Contrast Media] Nausea And Vomiting    Hot flashes    Nsaids Other (See Comments)    Pt reports not able to take due to kidney disease   Clonidine Rash    Patch    Family History: Family History  Problem Relation Age of Onset   Prostate cancer Brother    Kidney failure Mother    Lung cancer Brother    Bladder Cancer Neg Hx    Kidney cancer Neg Hx     Social History:  reports that he has never smoked. He has never used smokeless tobacco. He reports current alcohol use. He reports that he does not use drugs.   Physical Exam: BP (!) 174/80   Pulse 61   Ht 6' (1.829 m)   Wt 198 lb (89.8 kg)   BMI 26.85 kg/m   Constitutional:  Alert and oriented, No acute distress. HEENT: [place]  AT Respiratory: Normal respiratory effort, no increased work of breathing. GU: Prostate 60 g, smooth without nodules Psychiatric: Normal mood and affect.   Assessment & Plan:    1.  Elevated PSA PSA bumped to 8.6 Benign DRE Discuss options of prostate MRI vs. repeating the PSA in 2-3 months and MRI if persistently elevated; he elected the latter. Follow up lab visit scheduled.  I have reviewed the above documentation for accuracy and completeness, and I agree with the above.   [name], MD  [facility] [address] [place], [place] [address] [phone]

## 2023-03-06 NOTE — Progress Notes (Signed)
Pedro Robinson presents for an office/procedure visit. BP today is  high He is complaint/noncompliant with BP medication. Greater than 140/90. Provider  notified. Pt advised to see your PCP  Pt voiced understanding.

## 2023-06-03 ENCOUNTER — Other Ambulatory Visit: Payer: Self-pay

## 2023-06-03 DIAGNOSIS — R972 Elevated prostate specific antigen [PSA]: Secondary | ICD-10-CM

## 2023-06-06 ENCOUNTER — Other Ambulatory Visit: Payer: Medicare Other

## 2023-06-06 DIAGNOSIS — R972 Elevated prostate specific antigen [PSA]: Secondary | ICD-10-CM

## 2023-06-07 LAB — PSA: Prostate Specific Ag, Serum: 7.4 ng/mL — ABNORMAL HIGH (ref 0.0–4.0)

## 2024-02-26 ENCOUNTER — Other Ambulatory Visit: Payer: Self-pay

## 2024-02-26 DIAGNOSIS — R972 Elevated prostate specific antigen [PSA]: Secondary | ICD-10-CM

## 2024-02-27 ENCOUNTER — Other Ambulatory Visit: Payer: Self-pay

## 2024-02-27 DIAGNOSIS — R972 Elevated prostate specific antigen [PSA]: Secondary | ICD-10-CM

## 2024-02-28 LAB — PSA: Prostate Specific Ag, Serum: 7.4 ng/mL — ABNORMAL HIGH (ref 0.0–4.0)

## 2024-03-06 ENCOUNTER — Encounter: Payer: Self-pay | Admitting: Urology

## 2024-03-06 ENCOUNTER — Ambulatory Visit: Payer: Self-pay | Admitting: Urology

## 2024-03-06 VITALS — BP 162/78 | HR 49 | Ht 72.0 in | Wt 192.0 lb

## 2024-03-06 DIAGNOSIS — R972 Elevated prostate specific antigen [PSA]: Secondary | ICD-10-CM

## 2024-03-06 NOTE — Progress Notes (Signed)
 03/06/2024 11:46 AM   Pedro Robinson No 1946/05/28 [id number]  Referring provider: Shari Daughters, MD [address] [place],  [place] [address]  Chief Complaint  Patient presents with   Elevated PSA    Urologic history: 1.  Elevated PSA Biopsy October 2018 PSA 5.2; 82 g prostate; benign pathology Prostate MRI 09/2019 for PSA bump 8.2; 67 g gland; PI-RADS 1 lesions  HPI: 78 y.o. male presents for annual follow-up.  Doing well without problems since last years visit Denies dysuria, gross hematuria Denies flank, abdominal or pelvic pain PSA bump to 8.6 last year; follow-up PSA back to baseline at 7.4 PSA 02/27/2024 stable at 7.4   Prostate Specific Ag, Serum  Latest Ref Rng 0.0 - 4.0 ng/mL  07/16/2018 5.0 (H)   01/21/2019 6.0 (H)   07/20/2019 7.3 (H)   09/04/2019 8.4 (H)   02/22/2020 6.7 (H)   02/21/2021 6.4 (H)   02/26/2022 7.1 (H)   03/01/2023 8.6 (H)      PMH: Past Medical History:  Diagnosis Date   Chronic kidney disease    Gout    Hyperlipemia    Hypertension     Surgical History: Past Surgical History:  Procedure Laterality Date   CATARACT EXTRACTION W/PHACO Right 03/06/2021   Procedure: CATARACT EXTRACTION PHACO AND INTRAOCULAR LENS PLACEMENT (IOC) RIGHT 3.63 00:31.8;  Surgeon: Rosa College, MD;  Location: [facility];  Service: Ophthalmology;  Laterality: Right;   CATARACT EXTRACTION W/PHACO Left 03/27/2021   Procedure: CATARACT EXTRACTION PHACO AND INTRAOCULAR LENS PLACEMENT (IOC) LEFT 2.43 00:19.8;  Surgeon: Rosa College, MD;  Location: [facility];  Service: Ophthalmology;  Laterality: Left;   left foot      Home Medications:  Allergies as of 03/06/2024       Reactions   Ivp Dye [iodinated Contrast Media] Nausea And Vomiting   Hot flashes    Nsaids Other (See Comments)   Pt reports not able to take due to kidney disease   Clonidine Rash   Patch        Medication List        Accurate as of March 06, 2024 11:46 AM.  If you have any questions, ask your nurse or doctor.          amLODipine-olmesartan 10-40 MG tablet Commonly known as: AZOR Take 1 tablet by mouth daily.   cetirizine 10 MG tablet Commonly known as: ZYRTEC Take 10 mg by mouth daily.   Fish Oil 1000 MG Caps Take by mouth.   HM SUPER VITAMIN B COMPLEX/C PO Take by mouth daily.   hydrALAZINE 50 MG tablet Commonly known as: APRESOLINE   latanoprost 0.005 % ophthalmic solution Commonly known as: XALATAN 1 drop at bedtime.   multivitamin with minerals tablet Take 1 tablet by mouth daily.   rosuvastatin 10 MG tablet Commonly known as: CRESTOR Take 10 mg by mouth at bedtime.   Vitamin D3 1.25 MG (50000 UT) Tabs Take 2,000 Units by mouth.        Allergies:  Allergies  Allergen Reactions   Ivp Dye [Iodinated Contrast Media] Nausea And Vomiting    Hot flashes    Nsaids Other (See Comments)    Pt reports not able to take due to kidney disease   Clonidine Rash    Patch    Family History: Family History  Problem Relation Age of Onset   Prostate cancer Brother    Kidney failure Mother    Lung cancer Brother  Bladder Cancer Neg Hx    Kidney cancer Neg Hx     Social History:  reports that he has never smoked. He has never used smokeless tobacco. He reports current alcohol use. He reports that he does not use drugs.   Physical Exam: BP (!) 162/78   Pulse (!) 49   Ht 6' (1.829 m)   Wt 192 lb (87.1 kg)   BMI 26.04 kg/m   Constitutional:  Alert and oriented, No acute distress. HEENT: [place] AT Respiratory: Normal respiratory effort, no increased work of breathing. GU: Declined DRE Psychiatric: Normal mood and affect.   Assessment & Plan:    1.  Elevated PSA Stable Follow-up 1 year with PSA   Geraline Knapp, MD  [facility] [address], [address] [place], [place] [address] [phone]

## 2025-03-02 ENCOUNTER — Other Ambulatory Visit

## 2025-03-05 ENCOUNTER — Ambulatory Visit: Admitting: Urology
